# Patient Record
Sex: Male | Born: 1940 | Race: White | Hispanic: No | Marital: Married | Smoking: Never smoker
Health system: Southern US, Community
[De-identification: ages and names within clinical notes are randomized; demographics above are authoritative.]

## PROBLEM LIST (undated history)

## (undated) DIAGNOSIS — C801 Malignant (primary) neoplasm, unspecified: Secondary | ICD-10-CM

## (undated) DIAGNOSIS — I10 Essential (primary) hypertension: Secondary | ICD-10-CM

## (undated) DIAGNOSIS — E785 Hyperlipidemia, unspecified: Secondary | ICD-10-CM

## (undated) DIAGNOSIS — L570 Actinic keratosis: Secondary | ICD-10-CM

## (undated) DIAGNOSIS — I739 Peripheral vascular disease, unspecified: Secondary | ICD-10-CM

## (undated) DIAGNOSIS — K219 Gastro-esophageal reflux disease without esophagitis: Secondary | ICD-10-CM

## (undated) HISTORY — PX: CATARACT EXTRACTION, BILATERAL: SHX1313

## (undated) HISTORY — DX: Essential (primary) hypertension: I10

## (undated) HISTORY — PX: OTHER SURGICAL HISTORY: SHX169

## (undated) HISTORY — DX: Hyperlipidemia, unspecified: E78.5

## (undated) HISTORY — DX: Actinic keratosis: L57.0

## (undated) HISTORY — DX: Gastro-esophageal reflux disease without esophagitis: K21.9

---

## 2017-11-16 DIAGNOSIS — K219 Gastro-esophageal reflux disease without esophagitis: Secondary | ICD-10-CM | POA: Insufficient documentation

## 2017-11-16 DIAGNOSIS — I10 Essential (primary) hypertension: Secondary | ICD-10-CM | POA: Insufficient documentation

## 2017-11-16 DIAGNOSIS — M1712 Unilateral primary osteoarthritis, left knee: Secondary | ICD-10-CM | POA: Insufficient documentation

## 2017-11-16 DIAGNOSIS — E785 Hyperlipidemia, unspecified: Secondary | ICD-10-CM | POA: Insufficient documentation

## 2018-01-19 ENCOUNTER — Ambulatory Visit: Payer: Medicare Other | Admitting: Internal Medicine

## 2018-12-20 DIAGNOSIS — Z96652 Presence of left artificial knee joint: Secondary | ICD-10-CM | POA: Insufficient documentation

## 2019-03-23 DIAGNOSIS — C4491 Basal cell carcinoma of skin, unspecified: Secondary | ICD-10-CM

## 2019-03-23 HISTORY — DX: Basal cell carcinoma of skin, unspecified: C44.91

## 2019-05-30 ENCOUNTER — Other Ambulatory Visit: Payer: Self-pay | Admitting: Internal Medicine

## 2019-05-30 DIAGNOSIS — G459 Transient cerebral ischemic attack, unspecified: Secondary | ICD-10-CM

## 2019-05-30 DIAGNOSIS — H539 Unspecified visual disturbance: Secondary | ICD-10-CM

## 2019-06-15 ENCOUNTER — Ambulatory Visit
Admission: RE | Admit: 2019-06-15 | Discharge: 2019-06-15 | Disposition: A | Discharge status: Home / Self Care | Payer: Medicare Other | Source: Ambulatory Visit | Attending: Internal Medicine | Admitting: Internal Medicine

## 2019-06-15 ENCOUNTER — Other Ambulatory Visit: Payer: Self-pay

## 2019-06-15 DIAGNOSIS — G459 Transient cerebral ischemic attack, unspecified: Secondary | ICD-10-CM | POA: Insufficient documentation

## 2019-06-15 DIAGNOSIS — H539 Unspecified visual disturbance: Secondary | ICD-10-CM | POA: Insufficient documentation

## 2019-06-22 ENCOUNTER — Ambulatory Visit (INDEPENDENT_AMBULATORY_CARE_PROVIDER_SITE_OTHER): Payer: Medicare Other | Admitting: Vascular Surgery

## 2019-06-22 ENCOUNTER — Encounter (INDEPENDENT_AMBULATORY_CARE_PROVIDER_SITE_OTHER): Payer: Self-pay | Admitting: Vascular Surgery

## 2019-06-22 ENCOUNTER — Other Ambulatory Visit: Payer: Self-pay

## 2019-06-22 VITALS — BP 139/93 | HR 82 | Resp 16 | Ht 68.0 in | Wt 198.0 lb

## 2019-06-22 DIAGNOSIS — I6523 Occlusion and stenosis of bilateral carotid arteries: Secondary | ICD-10-CM

## 2019-06-22 DIAGNOSIS — I10 Essential (primary) hypertension: Secondary | ICD-10-CM | POA: Diagnosis not present

## 2019-06-22 DIAGNOSIS — E782 Mixed hyperlipidemia: Secondary | ICD-10-CM | POA: Diagnosis not present

## 2019-06-22 DIAGNOSIS — K219 Gastro-esophageal reflux disease without esophagitis: Secondary | ICD-10-CM | POA: Diagnosis not present

## 2019-06-22 DIAGNOSIS — M1712 Unilateral primary osteoarthritis, left knee: Secondary | ICD-10-CM

## 2019-06-22 NOTE — Progress Notes (Signed)
MRN : [id number]  Gregory Stark is a 78 y.o. (05-26-1941) male who presents with chief complaint of No chief complaint on file. Marland Kitchen  History of Present Illness:   The patient is seen for evaluation of carotid stenosis. The carotid stenosis was identified after duplex ultrasound was obtained secondary to double vision.  Patient reports (and his wife is with him who confirms) he has had episodes of double vision for over a year.  Typically they last for a few minutes.  The concern recently was they seem to be occurring more frequently and lasting a bit longer.  The patient denies true amaurosis fugax. There is no recent history of TIA symptoms or focal motor deficits. There is no prior documented CVA.  There is no history of migraine headaches. There is no history of seizures.  The patient is taking enteric-coated aspirin 81 mg daily.  The patient has a history of coronary artery disease, no recent episodes of angina or shortness of breath. The patient denies PAD or claudication symptoms. There is a history of hyperlipidemia which is being treated with a statin.  CT scan of the head without contrast dated 06/15/2019 is reviewed by myself no evidence of infarct.  Calcifications are noted at the carotid siphon.  Small vessel disease is also identified.  Carotid ultrasound dated 06/15/2019 is reviewed by me and is consistent with a 60 to 79% stenosis of the right internal carotid and a 40 to 59% diameter reduction of the left internal carotid  No outpatient medications have been marked as taking for the 06/22/19 encounter (Appointment) with Delana Meyer, Dolores Lory, MD.    No past medical history on file.    Social History Social History   Tobacco Use  . Smoking status: Not on file  Substance Use Topics  . Alcohol use: Not on file  . Drug use: Not on file    Family History No family history on file.  No family history of bleeding/clotting disorders, porphyria or autoimmune  disease   Not on File   REVIEW OF SYSTEMS (Negative unless checked)  Constitutional: [] Weight loss  [] Fever  [] Chills Cardiac: [] Chest pain   [] Chest pressure   [] Palpitations   [] Shortness of breath when laying flat   [] Shortness of breath with exertion. Vascular:  [] Pain in legs with walking   [] Pain in legs at rest  [] History of DVT   [] Phlebitis   [] Swelling in legs   [] Varicose veins   [] Non-healing ulcers Pulmonary:   [] Uses home oxygen   [] Productive cough   [] Hemoptysis   [] Wheeze  [] COPD   [] Asthma Neurologic:  [] Dizziness   [] Seizures   [] History of stroke   [x] History of TIA  [] Aphasia   [x] Vissual changes   [] Weakness or numbness in arm   [] Weakness or numbness in leg Musculoskeletal:   [] Joint swelling   [] Joint pain   [] Low back pain Hematologic:  [] Easy bruising  [] Easy bleeding   [] Hypercoagulable state   [] Anemic Gastrointestinal:  [] Diarrhea   [] Vomiting  [x] Gastroesophageal reflux/heartburn   [] Difficulty swallowing. Genitourinary:  [] Chronic kidney disease   [] Difficult urination  [] Frequent urination   [] Blood in urine Skin:  [] Rashes   [] Ulcers  Psychological:  [] History of anxiety   []  History of major depression.  Physical Examination  There were no vitals filed for this visit. There is no height or weight on file to calculate BMI. Gen: WD/WN, NAD Head: [place]/AT, No temporalis wasting.  Ear/Nose/Throat: Hearing grossly intact, nares w/o erythema or  drainage, poor dentition Eyes: PER, EOMI, sclera nonicteric.  Neck: Supple, no masses.  No bruit or JVD.  Pulmonary:  Good air movement, clear to auscultation bilaterally, no use of accessory muscles.  Cardiac: RRR, normal S1, S2, no Murmurs. Vascular: Right carotid bruit Vessel Right Left  Radial Palpable Palpable  Brachial Palpable Palpable  Carotid Palpable Palpable  Gastrointestinal: soft, non-distended. No guarding/no peritoneal signs.  Musculoskeletal: M/S 5/5 throughout.  No deformity or atrophy.    Neurologic: CN 2-12 intact. Pain and light touch intact in extremities.  Symmetrical.  Speech is fluent. Motor exam as listed above. Psychiatric: Judgment intact, Mood & affect appropriate for pt's clinical situation. Dermatologic: No rashes or ulcers noted.  No changes consistent with cellulitis.  CBC No results found for: WBC, HGB, HCT, MCV, PLT  BMET No results found for: NA, K, CL, CO2, GLUCOSE, BUN, CREATININE, CALCIUM, GFRNONAA, GFRAA CrCl cannot be calculated (No successful lab value found.).  COAG No results found for: INR, PROTIME  Radiology CT HEAD WO CONTRAST  Result Date: 06/15/2019 CLINICAL DATA:  Episodic double vision for the past 1.5 years EXAM: CT HEAD WITHOUT CONTRAST TECHNIQUE: Contiguous axial images were obtained from the base of the skull through the vertex without intravenous contrast. COMPARISON:  None. FINDINGS: Brain: No evidence of acute infarction, hemorrhage, hydrocephalus, extra-axial collection or mass lesion/mass effect. Symmetric prominence of the ventricles, cisterns and sulci compatible with parenchymal volume loss. Patchy areas of white matter hypoattenuation are most compatible with chronic microvascular angiopathy. Vascular: Atherosclerotic calcification of the carotid siphons. No hyperdense vessel. Skull: No calvarial fracture or suspicious osseous lesion. No scalp swelling or hematoma. Sinuses/Orbits: Paranasal sinuses and mastoid air cells are predominantly clear. Included orbital structures are unremarkable. Other: None IMPRESSION: 1. No acute intracranial abnormality. 2. 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 Electronically Signed   By: Lovena Le M.D.   On: 06/15/2019 17:29     Assessment/Plan 1. Bilateral carotid artery stenosis The patient remains asymptomatic with respect to the carotid stenosis.  However, the patient has now progressed and has a lesion the is >70%.  Patient should undergo CT  angiography of the carotid arteries to define the degree of stenosis of the internal carotid arteries bilaterally and the anatomic suitability for surgery vs. intervention.  If the patient does indeed need surgery cardiac clearance will be required, once cleared the patient will be scheduled for surgery.  The risks, benefits and alternative therapies were reviewed in detail with the patient.  All questions were answered.  The patient agrees to proceed with imaging.  Continue antiplatelet therapy as prescribed. Continue management of CAD, HTN and Hyperlipidemia. Healthy heart diet, encouraged exercise at least 4 times per week.   - CT ANGIO NECK W OR WO CONTRAST; Future  2. HTN, goal below 140/80 Continue antihypertensive medications as already ordered, these medications have been reviewed and there are no changes at this time.   3. Gastroesophageal reflux disease without esophagitis Continue PPI as already ordered, this medication has been reviewed and there are no changes at this time.  Avoidence of caffeine and alcohol  Moderate elevation of the head of the bed   4. Mixed hyperlipidemia Continue statin as ordered and reviewed, no changes at this time   5. Primary osteoarthritis of left knee Continue NSAID medications as already ordered, these medications have been reviewed and there are no changes at this time.  Continued activity and therapy was stressed.   Hortencia Pilar, MD  06/22/2019  8:39 AM

## 2019-06-23 ENCOUNTER — Encounter (INDEPENDENT_AMBULATORY_CARE_PROVIDER_SITE_OTHER): Payer: Self-pay | Admitting: Vascular Surgery

## 2019-06-23 DIAGNOSIS — I6529 Occlusion and stenosis of unspecified carotid artery: Secondary | ICD-10-CM | POA: Insufficient documentation

## 2019-07-05 ENCOUNTER — Other Ambulatory Visit: Payer: Self-pay

## 2019-07-05 ENCOUNTER — Ambulatory Visit
Admission: RE | Admit: 2019-07-05 | Discharge: 2019-07-05 | Disposition: A | Discharge status: Home / Self Care | Payer: Medicare Other | Source: Ambulatory Visit | Attending: Vascular Surgery | Admitting: Vascular Surgery

## 2019-07-05 DIAGNOSIS — I6523 Occlusion and stenosis of bilateral carotid arteries: Secondary | ICD-10-CM | POA: Insufficient documentation

## 2019-07-05 LAB — POCT I-STAT CREATININE: Creatinine, Ser: 1.2 mg/dL (ref 0.61–1.24)

## 2019-07-05 MED ORDER — IOHEXOL 350 MG/ML SOLN
75.0000 mL | Freq: Once | INTRAVENOUS | Status: AC | PRN
  Administered 2019-07-05: 08:00:00 75 mL via INTRAVENOUS

## 2019-07-10 ENCOUNTER — Encounter (INDEPENDENT_AMBULATORY_CARE_PROVIDER_SITE_OTHER): Payer: Self-pay

## 2019-07-13 ENCOUNTER — Encounter (INDEPENDENT_AMBULATORY_CARE_PROVIDER_SITE_OTHER): Payer: Self-pay | Admitting: Vascular Surgery

## 2019-07-13 ENCOUNTER — Other Ambulatory Visit: Payer: Self-pay

## 2019-07-13 ENCOUNTER — Ambulatory Visit (INDEPENDENT_AMBULATORY_CARE_PROVIDER_SITE_OTHER): Payer: Medicare Other | Admitting: Vascular Surgery

## 2019-07-13 VITALS — BP 151/81 | HR 98 | Resp 16 | Wt 204.0 lb

## 2019-07-13 DIAGNOSIS — I6523 Occlusion and stenosis of bilateral carotid arteries: Secondary | ICD-10-CM | POA: Diagnosis not present

## 2019-07-13 DIAGNOSIS — I10 Essential (primary) hypertension: Secondary | ICD-10-CM | POA: Diagnosis not present

## 2019-07-13 DIAGNOSIS — K219 Gastro-esophageal reflux disease without esophagitis: Secondary | ICD-10-CM | POA: Diagnosis not present

## 2019-07-13 DIAGNOSIS — E782 Mixed hyperlipidemia: Secondary | ICD-10-CM

## 2019-07-13 NOTE — Progress Notes (Signed)
MRN : [id number]  Gregory Stark is a 79 y.o. (October 03, 1940) male who presents with chief complaint of No chief complaint on file. Marland Kitchen  History of Present Illness:   The patient is seen for follow up evaluation of carotid stenosis status post CT angiogram. CT scan was done 07/05/2019. Patient reports that the test went well with no problems or complications.   The patient denies interval amaurosis fugax. There is no recent or interval TIA symptoms or focal motor deficits. There is no prior documented CVA.  The patient is taking enteric-coated aspirin 81 mg daily.  There is no history of migraine headaches. There is no history of seizures.  The patient has a history of coronary artery disease, no recent episodes of angina or shortness of breath. The patient denies PAD or claudication symptoms. There is a history of hyperlipidemia which is being treated with a statin.    CT angiogram is reviewed by me personally and shows greater than 80% stenosis of the right internal carotid artery at its origin and 60 to 70% stenosis at the proximal internal carotid artery on the left.  As noted I have personally reviewed the scan and believe that the radiology report is under calling the right internal carotid artery stenosis the markings indicate they measured the internal carotid artery diameter on image 155 however if you look more proximally to image 152 clearly the diameter is quite a bit smaller suggesting even greater stenosis than 70% as I have indicated above.  No outpatient medications have been marked as taking for the 07/13/19 encounter (Appointment) with Delana Meyer, Dolores Lory, MD.    Past Medical History:  Diagnosis Date  . Gastroesophageal reflux disease   . Hyperlipidemia   . Hypertension     Past Surgical History:  Procedure Laterality Date  . arthroscoppy knee Left   . CATARACT EXTRACTION, BILATERAL      Social History Social History   Tobacco Use  . Smoking status: Never  Smoker  . Smokeless tobacco: Never Used  Substance Use Topics  . Alcohol use: Not on file  . Drug use: Not on file    Family History No family history on file.  No Known Allergies   REVIEW OF SYSTEMS (Negative unless checked)  Constitutional: [] Weight loss  [] Fever  [] Chills Cardiac: [] Chest pain   [] Chest pressure   [] Palpitations   [] Shortness of breath when laying flat   [] Shortness of breath with exertion. Vascular:  [] Pain in legs with walking   [] Pain in legs at rest  [] History of DVT   [] Phlebitis   [] Swelling in legs   [] Varicose veins   [] Non-healing ulcers Pulmonary:   [] Uses home oxygen   [] Productive cough   [] Hemoptysis   [] Wheeze  [] COPD   [] Asthma Neurologic:  [] Dizziness   [] Seizures   [] History of stroke   [] History of TIA  [] Aphasia   [] Vissual changes   [] Weakness or numbness in arm   [] Weakness or numbness in leg Musculoskeletal:   [] Joint swelling   [x] Joint pain   [] Low back pain Hematologic:  [] Easy bruising  [] Easy bleeding   [] Hypercoagulable state   [] Anemic Gastrointestinal:  [] Diarrhea   [] Vomiting  [] Gastroesophageal reflux/heartburn   [] Difficulty swallowing. Genitourinary:  [] Chronic kidney disease   [] Difficult urination  [] Frequent urination   [] Blood in urine Skin:  [] Rashes   [] Ulcers  Psychological:  [] History of anxiety   []  History of major depression.  Physical Examination  There were no vitals filed for this  visit. There is no height or weight on file to calculate BMI. Gen: WD/WN, NAD Head: [place]/AT, No temporalis wasting.  Ear/Nose/Throat: Hearing grossly intact, nares w/o erythema or drainage Eyes: PER, EOMI, sclera nonicteric.  Neck: Supple, no large masses.   Pulmonary:  Good air movement, no audible wheezing bilaterally, no use of accessory muscles.  Cardiac: RRR, no JVD Vascular: bilateral carotid bruit Gastrointestinal: Non-distended. No guarding/no peritoneal signs.  Musculoskeletal: M/S 5/5 throughout.  No deformity or atrophy.   Neurologic: CN 2-12 intact. Symmetrical.  Speech is fluent. Motor exam as listed above. Psychiatric: Judgment intact, Mood & affect appropriate for pt's clinical situation. Dermatologic: No rashes or ulcers noted.  No changes consistent with cellulitis. Lymph : No lichenification or skin changes of chronic lymphedema.  CBC No results found for: WBC, HGB, HCT, MCV, PLT  BMET    Component Value Date/Time   CREATININE 1.20 07/05/2019 0809   CrCl cannot be calculated (Unknown ideal weight.).  COAG No results found for: INR, PROTIME  Radiology CT HEAD WO CONTRAST  Result Date: 06/15/2019 CLINICAL DATA:  Episodic double vision for the past 1.5 years EXAM: CT HEAD WITHOUT CONTRAST TECHNIQUE: Contiguous axial images were obtained from the base of the skull through the vertex without intravenous contrast. COMPARISON:  None. FINDINGS: Brain: No evidence of acute infarction, hemorrhage, hydrocephalus, extra-axial collection or mass lesion/mass effect. Symmetric prominence of the ventricles, cisterns and sulci compatible with parenchymal volume loss. Patchy areas of white matter hypoattenuation are most compatible with chronic microvascular angiopathy. Vascular: Atherosclerotic calcification of the carotid siphons. No hyperdense vessel. Skull: No calvarial fracture or suspicious osseous lesion. No scalp swelling or hematoma. Sinuses/Orbits: Paranasal sinuses and mastoid air cells are predominantly clear. Included orbital structures are unremarkable. Other: None IMPRESSION: 1. No acute intracranial abnormality. 2. 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 Electronically Signed   By: Lovena Le M.D.   On: 06/15/2019 17:29   CT ANGIO NECK W OR WO CONTRAST  Result Date: 07/05/2019 CLINICAL DATA:  Carotid stenosis. EXAM: CT ANGIOGRAPHY NECK TECHNIQUE: Multidetector CT imaging of the neck was performed using the standard protocol during bolus administration of  intravenous contrast. Multiplanar CT image reconstructions and MIPs were obtained to evaluate the vascular anatomy. Carotid stenosis measurements (when applicable) are obtained utilizing NASCET criteria, using the distal internal carotid diameter as the denominator. CONTRAST:  41mL OMNIPAQUE IOHEXOL 350 MG/ML SOLN COMPARISON:  Carotid Doppler 06/15/2019 FINDINGS: Aortic arch: Atherosclerotic calcification aortic arch without aneurysm or dissection. Atherosclerotic calcification proximal great vessels without significant stenosis. Right carotid system: Right common carotid artery widely patent. Extensive atherosclerotic calcification proximal right internal carotid artery. Minimal luminal diameter 1.2 mm, corresponding to 70% diameter stenosis. Extensive atherosclerotic calcification in the cavernous carotid on the right which is patent. Left carotid system: Mild atherosclerotic disease left common carotid artery without stenosis. Calcific plaque proximal left internal carotid artery. Minimal luminal diameter 1.5 mm, corresponding to 65% diameter stenosis. Heavily calcified left cavernous carotid. Vertebral arteries: Extensive calcific plaque proximal right vertebral artery causing severe stenosis. Remainder of the right vertebral artery is patent to the basilar without stenosis. Occlusion proximal left vertebral artery with diffusely diseased proximal left vertebral artery reconstitutes in the mid neck and is then patent to the basilar. Skeleton: No acute skeletal abnormality. 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 Other neck: Negative for soft tissue mass or adenopathy. Upper chest: Patchy ground-glass density in the right upper lobe. Possible scarring or pneumonitis. No effusion. IMPRESSION: 1.  Heavily calcified plaque proximal right internal carotid artery, narrowing the lumen by 70% diameter stenosis. Extensive atherosclerotic calcification left cavernous carotid. 2. Calcific plaque  proximal left internal carotid artery narrowing the lumen by 65% diameter stenosis. Extensive atherosclerotic calcification left cavernous carotid 3. Severe calcific stenosis proximal right vertebral artery which is then patent to the basilar 4. 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 5. Patchy right upper lobe ground-glass density possible acute or chronic infiltrate. Electronically Signed   By: Franchot Gallo M.D.   On: 07/05/2019 09:18     Assessment/Plan 1. Bilateral carotid artery stenosis Recommend:  The patient remains asymptomatic with respect to the carotid stenosis.  However, the patient has now progressed and has a lesion the is >80%.  CT angiogram is reviewed by me personally and shows greater than 80% stenosis of the right internal carotid artery at its origin and 60 to 70% stenosis at the proximal internal carotid artery on the left.  As noted I have personally reviewed the scan and believe that the radiology report is under calling the right internal carotid artery stenosis the markings indicate they measured the internal carotid artery diameter on image 155 however if you look more proximally to image 152 clearly the diameter is quite a bit smaller suggesting even greater stenosis than 70% as I have indicated above.  On review of CT scan as noted above he does have significant calcifications.  His lesion is fairly low in the neck and amenable to surgery.  These 2 factors would favor endarterectomy over stenting however stenting would certainly be a possibility if significant cardiac morbidity were uncovered with his evaluation.  The patient does indeed need surgery, therefore, cardiac clearance will be arranged. Once cleared the patient will be scheduled for right carotid surgery.  The risks, benefits and alternative therapies were reviewed in detail with the patient.  All questions were answered.  The patient agrees to proceed with surgery  of the right  carotid artery.  Continue antiplatelet therapy as prescribed. Continue management of CAD, HTN and Hyperlipidemia. Healthy heart diet, encouraged exercise at least 4 times per week.   - Ambulatory referral to Cardiology  2. HTN, goal below 140/80 Continue antihypertensive medications as already ordered, these medications have been reviewed and there are no changes at this time.   3. Gastroesophageal reflux disease without esophagitis Continue PPI as already ordered, this medication has been reviewed and there are no changes at this time.  Avoidence of caffeine and alcohol  Moderate elevation of the head of the bed   4. Mixed hyperlipidemia Continue statin as ordered and reviewed, no changes at this time    Hortencia Pilar, MD  07/13/2019 8:31 AM

## 2019-07-18 DIAGNOSIS — R9431 Abnormal electrocardiogram [ECG] [EKG]: Secondary | ICD-10-CM | POA: Insufficient documentation

## 2019-07-24 ENCOUNTER — Encounter (INDEPENDENT_AMBULATORY_CARE_PROVIDER_SITE_OTHER): Payer: Self-pay

## 2019-07-31 ENCOUNTER — Telehealth (INDEPENDENT_AMBULATORY_CARE_PROVIDER_SITE_OTHER): Payer: Self-pay

## 2019-07-31 NOTE — Telephone Encounter (Signed)
I attempted to contact the patient on both his home and mobile number, a message was left on both for return call.

## 2019-07-31 NOTE — Telephone Encounter (Signed)
Patient returned my call and was given the information about his surgery on 08/09/19 with Dr. Delana Meyer. Patient will do his pre-op phone call on 08/07/19 between 8-1 and his covid testing on 08/08/19 between 12:30-2:30 pm at the [facility]. Pre-surgical instructions were discussed and will be mailed to the patient as well.

## 2019-08-07 ENCOUNTER — Other Ambulatory Visit (INDEPENDENT_AMBULATORY_CARE_PROVIDER_SITE_OTHER): Payer: Self-pay | Admitting: Nurse Practitioner

## 2019-08-07 ENCOUNTER — Other Ambulatory Visit: Payer: Self-pay

## 2019-08-07 ENCOUNTER — Encounter
Admission: RE | Admit: 2019-08-07 | Discharge: 2019-08-07 | Disposition: A | Discharge status: Home / Self Care | Payer: Medicare Other | Source: Ambulatory Visit | Attending: Vascular Surgery | Admitting: Vascular Surgery

## 2019-08-07 HISTORY — DX: Peripheral vascular disease, unspecified: I73.9

## 2019-08-07 HISTORY — DX: Malignant (primary) neoplasm, unspecified: C80.1

## 2019-08-07 NOTE — Patient Instructions (Signed)
Your procedure is scheduled on: Wed. 2/3 Report to Day Surgery. To find out your arrival time please call [phone] between 1PM - 3PM on Tues 2/2.  Remember: Instructions that are not followed completely may result in serious medical risk,  up to and including death, or upon the discretion of your surgeon and anesthesiologist your  surgery may need to be rescheduled.     _X__ 1. Do not eat food after midnight the night before your procedure.                 No gum chewing or hard candies. You may drink clear liquids up to 2 hours                 before you are scheduled to arrive for your surgery- DO not drink clear                 liquids within 2 hours of the start of your surgery.                 Clear Liquids include:  water, apple juice without pulp, clear carbohydrate                 drink such as Clearfast of Gatorade, Black Coffee or Tea (Do not add                 anything to coffee or tea).  __X__2.  On the morning of surgery brush your teeth with toothpaste and water, you                may rinse your mouth with mouthwash if you wish.  Do not swallow any toothpaste of mouthwash.     ___ 3.  No Alcohol for 24 hours before or after surgery.   ___ 4.  Do Not Smoke or use e-cigarettes For 24 Hours Prior to Your Surgery.                 Do not use any chewable tobacco products for at least 6 hours prior to                 surgery.  ____  5.  Bring all medications with you on the day of surgery if instructed.   __x__  6.  Notify your doctor if there is any change in your medical condition      (cold, fever, infections).     Do not wear jewelry, make-up, hairpins, clips or nail polish. Do not wear lotions, powders, or perfumes. You may wear deodorant. Do not shave 48 hours prior to surgery. Men may shave face and neck. Do not bring valuables to the hospital.    [facility] is not responsible for any belongings or valuables.  Contacts, dentures or  bridgework may not be worn into surgery. Leave your suitcase in the car. After surgery it may be brought to your room. For patients admitted to the hospital, discharge time is determined by your treatment team.   Patients discharged the day of surgery will not be allowed to drive home.   Please read over the following fact sheets that you were given:    ____ Take these medicines the morning of surgery with A SIP OF WATER:    1. none  2.   3.   4.  5.  6.  ____ Fleet Enema (as directed)   __x__ Use CHG Soap as directed  ____ Use inhalers on the day of  surgery  ____ Stop metformin 2 days prior to surgery    ____ Take 1/2 of usual insulin dose the night before surgery. No insulin the morning          of surgery.   ___x_ Continue aspirin   ___x_ Stop Anti-inflammatories on  No ibuprofen or aleve until after the surgery   ____ Stop supplements until after surgery.    ____ Bring C-Pap to the hospital.

## 2019-08-08 ENCOUNTER — Other Ambulatory Visit
Admission: RE | Admit: 2019-08-08 | Discharge: 2019-08-08 | Disposition: A | Discharge status: Home / Self Care | Payer: Medicare Other | Source: Ambulatory Visit | Attending: Vascular Surgery | Admitting: Vascular Surgery

## 2019-08-08 LAB — BASIC METABOLIC PANEL
Anion gap: 12 (ref 5–15)
BUN: 18 mg/dL (ref 8–23)
CO2: 23 mmol/L (ref 22–32)
Calcium: 9.9 mg/dL (ref 8.9–10.3)
Chloride: 102 mmol/L (ref 98–111)
Creatinine, Ser: 1.25 mg/dL — ABNORMAL HIGH (ref 0.61–1.24)
GFR calc Af Amer: >60 mL/min (ref >60)
GFR calc non Af Amer: 55 mL/min — ABNORMAL LOW (ref >60)
Glucose, Bld: 119 mg/dL — ABNORMAL HIGH (ref 70–99)
Potassium: 3.8 mmol/L (ref 3.5–5.1)
Sodium: 137 mmol/L (ref 135–145)

## 2019-08-08 LAB — CBC WITH DIFFERENTIAL/PLATELET
Abs Immature Granulocytes: 0.02 10*3/uL (ref 0.00–0.07)
Basophils Absolute: 0.1 10*3/uL (ref 0.0–0.1)
Basophils Relative: 1 %
Eosinophils Absolute: 0.3 10*3/uL (ref 0.0–0.5)
Eosinophils Relative: 4 %
HCT: 49.3 % (ref 39.0–52.0)
Hemoglobin: 17.2 g/dL — ABNORMAL HIGH (ref 13.0–17.0)
Immature Granulocytes: 0 %
Lymphocytes Relative: 27 %
Lymphs Abs: 1.9 10*3/uL (ref 0.7–4.0)
MCH: 31.9 pg (ref 26.0–34.0)
MCHC: 34.9 g/dL (ref 30.0–36.0)
MCV: 91.3 fL (ref 80.0–100.0)
Monocytes Absolute: 0.7 10*3/uL (ref 0.1–1.0)
Monocytes Relative: 10 %
Neutro Abs: 4 10*3/uL (ref 1.7–7.7)
Neutrophils Relative %: 58 %
Platelets: 210 10*3/uL (ref 150–400)
RBC: 5.4 MIL/uL (ref 4.22–5.81)
RDW: 13.2 % (ref 11.5–15.5)
WBC: 6.9 10*3/uL (ref 4.0–10.5)
nRBC: 0 % (ref 0.0–0.2)

## 2019-08-08 LAB — TYPE AND SCREEN
ABO/RH(D): B POS
Antibody Screen: NEGATIVE

## 2019-08-08 LAB — PROTIME-INR
INR: 1 (ref 0.8–1.2)
Prothrombin Time: 13.4 seconds (ref 11.4–15.2)

## 2019-08-08 LAB — SARS CORONAVIRUS 2 (TAT 6-24 HRS): SARS Coronavirus 2: NEGATIVE

## 2019-08-08 LAB — APTT: aPTT: 28 seconds (ref 24–36)

## 2019-08-09 ENCOUNTER — Inpatient Hospital Stay: Payer: Medicare Other

## 2019-08-09 ENCOUNTER — Other Ambulatory Visit: Payer: Self-pay

## 2019-08-09 ENCOUNTER — Encounter
Admission: RE | Disposition: A | Discharge status: Home / Self Care | Payer: Self-pay | Source: Home / Self Care | Attending: Vascular Surgery

## 2019-08-09 ENCOUNTER — Encounter: Payer: Self-pay | Admitting: Vascular Surgery

## 2019-08-09 ENCOUNTER — Inpatient Hospital Stay
Admission: RE | Admit: 2019-08-09 | Discharge: 2019-08-10 | DRG: 039 | Disposition: A | Discharge status: Home / Self Care | Payer: Medicare Other | Attending: Vascular Surgery | Admitting: Vascular Surgery

## 2019-08-09 DIAGNOSIS — E782 Mixed hyperlipidemia: Secondary | ICD-10-CM | POA: Diagnosis present

## 2019-08-09 DIAGNOSIS — I739 Peripheral vascular disease, unspecified: Secondary | ICD-10-CM | POA: Diagnosis present

## 2019-08-09 DIAGNOSIS — I10 Essential (primary) hypertension: Secondary | ICD-10-CM | POA: Diagnosis present

## 2019-08-09 DIAGNOSIS — I6521 Occlusion and stenosis of right carotid artery: Secondary | ICD-10-CM | POA: Diagnosis present

## 2019-08-09 DIAGNOSIS — K219 Gastro-esophageal reflux disease without esophagitis: Secondary | ICD-10-CM | POA: Diagnosis present

## 2019-08-09 DIAGNOSIS — Z20822 Contact with and (suspected) exposure to covid-19: Secondary | ICD-10-CM | POA: Diagnosis present

## 2019-08-09 HISTORY — PX: ENDARTERECTOMY: SHX5162

## 2019-08-09 LAB — MRSA PCR SCREENING: MRSA by PCR: NEGATIVE

## 2019-08-09 LAB — ABO/RH: ABO/RH(D): B POS

## 2019-08-09 LAB — GLUCOSE, CAPILLARY: Glucose-Capillary: 108 mg/dL — ABNORMAL HIGH (ref 70–99)

## 2019-08-09 SURGERY — ENDARTERECTOMY, CAROTID
Anesthesia: General | Site: Neck | Laterality: Right

## 2019-08-09 MED ORDER — PHENYLEPHRINE HCL-NACL 10-0.9 MG/250ML-% IV SOLN
INTRAVENOUS | Status: DC | PRN
  Administered 2019-08-09: 20 ug/min via INTRAVENOUS

## 2019-08-09 MED ORDER — PROPOFOL 10 MG/ML IV BOLUS
INTRAVENOUS | Status: AC
  Filled 2019-08-09: qty 40

## 2019-08-09 MED ORDER — CEFAZOLIN SODIUM-DEXTROSE 2-4 GM/100ML-% IV SOLN
2.0000 g | INTRAVENOUS | Status: AC
  Administered 2019-08-09: 2 g via INTRAVENOUS

## 2019-08-09 MED ORDER — FENTANYL CITRATE (PF) 100 MCG/2ML IJ SOLN: 25.0000 ug | INTRAMUSCULAR | Status: DC | PRN

## 2019-08-09 MED ORDER — VITAMIN D (ERGOCALCIFEROL) 1.25 MG (50000 UNIT) PO CAPS
50000.0000 [IU] | ORAL_CAPSULE | ORAL | Status: DC
  Filled 2019-08-09: qty 1

## 2019-08-09 MED ORDER — ROCURONIUM BROMIDE 50 MG/5ML IV SOLN
INTRAVENOUS | Status: AC
  Filled 2019-08-09: qty 1

## 2019-08-09 MED ORDER — DEXAMETHASONE SODIUM PHOSPHATE 10 MG/ML IJ SOLN
INTRAMUSCULAR | Status: DC | PRN
  Administered 2019-08-09: 10 mg via INTRAVENOUS

## 2019-08-09 MED ORDER — DOCUSATE SODIUM 100 MG PO CAPS
100.0000 mg | ORAL_CAPSULE | Freq: Every day | ORAL | Status: DC
  Administered 2019-08-10: 100 mg via ORAL
  Filled 2019-08-09: qty 1

## 2019-08-09 MED ORDER — VASOPRESSIN 20 UNIT/ML IV SOLN
INTRAVENOUS | Status: AC
  Filled 2019-08-09: qty 1

## 2019-08-09 MED ORDER — MEPERIDINE HCL 50 MG/ML IJ SOLN: 6.2500 mg | INTRAMUSCULAR | Status: DC | PRN

## 2019-08-09 MED ORDER — DEXMEDETOMIDINE HCL 200 MCG/2ML IV SOLN
INTRAVENOUS | Status: DC | PRN
  Administered 2019-08-09 (×2): 12 ug via INTRAVENOUS

## 2019-08-09 MED ORDER — VASOPRESSIN 20 UNIT/ML IV SOLN
INTRAVENOUS | Status: DC | PRN
  Administered 2019-08-09 (×3): 1 m[IU] via INTRAVENOUS

## 2019-08-09 MED ORDER — PRAVASTATIN SODIUM 40 MG PO TABS
40.0000 mg | ORAL_TABLET | Freq: Every day | ORAL | Status: DC
  Administered 2019-08-09: 40 mg via ORAL
  Filled 2019-08-09: qty 2
  Filled 2019-08-09 (×2): qty 1

## 2019-08-09 MED ORDER — LIDOCAINE HCL (PF) 1 % IJ SOLN
INTRAMUSCULAR | Status: AC
  Filled 2019-08-09: qty 30

## 2019-08-09 MED ORDER — OXYCODONE HCL 5 MG PO TABS: 5.0000 mg | ORAL_TABLET | Freq: Once | ORAL | Status: DC | PRN

## 2019-08-09 MED ORDER — FAMOTIDINE 20 MG PO TABS: 20.0000 mg | ORAL_TABLET | Freq: Once | ORAL | Status: AC

## 2019-08-09 MED ORDER — ALUM & MAG HYDROXIDE-SIMETH 200-200-20 MG/5ML PO SUSP: 15.0000 mL | ORAL | Status: DC | PRN

## 2019-08-09 MED ORDER — ASPIRIN EC 81 MG PO TBEC: 81.0000 mg | DELAYED_RELEASE_TABLET | Freq: Every day | ORAL | Status: DC

## 2019-08-09 MED ORDER — CEFAZOLIN SODIUM-DEXTROSE 2-4 GM/100ML-% IV SOLN
INTRAVENOUS | Status: AC
  Filled 2019-08-09: qty 100

## 2019-08-09 MED ORDER — ONDANSETRON HCL 4 MG/2ML IJ SOLN: 4.0000 mg | Freq: Four times a day (QID) | INTRAMUSCULAR | Status: DC | PRN

## 2019-08-09 MED ORDER — ASPIRIN 81 MG PO TBEC
81.0000 mg | DELAYED_RELEASE_TABLET | Freq: Every day | ORAL | Status: DC
  Administered 2019-08-10: 81 mg via ORAL
  Filled 2019-08-09 (×2): qty 1

## 2019-08-09 MED ORDER — LACTATED RINGERS IV SOLN: INTRAVENOUS | Status: DC

## 2019-08-09 MED ORDER — LIDOCAINE HCL (CARDIAC) PF 100 MG/5ML IV SOSY
PREFILLED_SYRINGE | INTRAVENOUS | Status: DC | PRN
  Administered 2019-08-09: 100 mg via INTRAVENOUS

## 2019-08-09 MED ORDER — GUAIFENESIN-DM 100-10 MG/5ML PO SYRP: 15.0000 mL | ORAL_SOLUTION | ORAL | Status: DC | PRN

## 2019-08-09 MED ORDER — ONDANSETRON HCL 4 MG/2ML IJ SOLN
INTRAMUSCULAR | Status: AC
  Filled 2019-08-09: qty 2

## 2019-08-09 MED ORDER — FENTANYL CITRATE (PF) 100 MCG/2ML IJ SOLN
INTRAMUSCULAR | Status: AC
  Filled 2019-08-09: qty 2

## 2019-08-09 MED ORDER — SUCCINYLCHOLINE CHLORIDE 20 MG/ML IJ SOLN
INTRAMUSCULAR | Status: AC
  Filled 2019-08-09: qty 1

## 2019-08-09 MED ORDER — CHLORHEXIDINE GLUCONATE CLOTH 2 % EX PADS: 6.0000 | MEDICATED_PAD | Freq: Once | CUTANEOUS | Status: DC

## 2019-08-09 MED ORDER — HEPARIN SODIUM (PORCINE) 5000 UNIT/ML IJ SOLN
INTRAMUSCULAR | Status: AC
  Filled 2019-08-09: qty 1

## 2019-08-09 MED ORDER — DEXMEDETOMIDINE HCL IN NACL 80 MCG/20ML IV SOLN
INTRAVENOUS | Status: AC
  Filled 2019-08-09: qty 20

## 2019-08-09 MED ORDER — CEFAZOLIN SODIUM-DEXTROSE 2-4 GM/100ML-% IV SOLN
2.0000 g | Freq: Three times a day (TID) | INTRAVENOUS | Status: AC
  Administered 2019-08-09 (×2): 2 g via INTRAVENOUS
  Filled 2019-08-09 (×2): qty 100

## 2019-08-09 MED ORDER — HEMOSTATIC AGENTS (NO CHARGE) OPTIME
TOPICAL | Status: DC | PRN
  Administered 2019-08-09: 1 via TOPICAL

## 2019-08-09 MED ORDER — HEPARIN SODIUM (PORCINE) 1000 UNIT/ML IJ SOLN
INTRAMUSCULAR | Status: AC
  Filled 2019-08-09: qty 1

## 2019-08-09 MED ORDER — PROMETHAZINE HCL 25 MG/ML IJ SOLN: 6.2500 mg | INTRAMUSCULAR | Status: DC | PRN

## 2019-08-09 MED ORDER — LIDOCAINE HCL (PF) 2 % IJ SOLN
INTRAMUSCULAR | Status: AC
  Filled 2019-08-09: qty 10

## 2019-08-09 MED ORDER — HEPARIN SODIUM (PORCINE) 1000 UNIT/ML IJ SOLN
INTRAMUSCULAR | Status: DC | PRN
  Administered 2019-08-09: 7000 [IU] via INTRAVENOUS

## 2019-08-09 MED ORDER — SUGAMMADEX SODIUM 200 MG/2ML IV SOLN
INTRAVENOUS | Status: DC | PRN
  Administered 2019-08-09: 200 mg via INTRAVENOUS

## 2019-08-09 MED ORDER — ACETAMINOPHEN 325 MG PO TABS
325.0000 mg | ORAL_TABLET | ORAL | Status: DC | PRN
  Administered 2019-08-10: 650 mg via ORAL
  Filled 2019-08-09: qty 2

## 2019-08-09 MED ORDER — PHENYLEPHRINE HCL (PRESSORS) 10 MG/ML IV SOLN
INTRAVENOUS | Status: DC | PRN
  Administered 2019-08-09: 200 ug via INTRAVENOUS
  Administered 2019-08-09 (×3): 100 ug via INTRAVENOUS

## 2019-08-09 MED ORDER — KCL IN DEXTROSE-NACL 20-5-0.9 MEQ/L-%-% IV SOLN
INTRAVENOUS | Status: DC
  Filled 2019-08-09 (×2): qty 1000

## 2019-08-09 MED ORDER — SODIUM CHLORIDE 0.9 % IV SOLN
INTRAVENOUS | Status: DC | PRN
  Administered 2019-08-09: 500 mL via INTRAMUSCULAR

## 2019-08-09 MED ORDER — ROCURONIUM BROMIDE 100 MG/10ML IV SOLN
INTRAVENOUS | Status: DC | PRN
  Administered 2019-08-09: 40 mg via INTRAVENOUS
  Administered 2019-08-09: 50 mg via INTRAVENOUS

## 2019-08-09 MED ORDER — ACETAMINOPHEN 650 MG RE SUPP: 325.0000 mg | RECTAL | Status: DC | PRN

## 2019-08-09 MED ORDER — DEXAMETHASONE SODIUM PHOSPHATE 10 MG/ML IJ SOLN
INTRAMUSCULAR | Status: AC
  Filled 2019-08-09: qty 1

## 2019-08-09 MED ORDER — FENTANYL CITRATE (PF) 100 MCG/2ML IJ SOLN
INTRAMUSCULAR | Status: DC | PRN
  Administered 2019-08-09: 25 ug via INTRAVENOUS
  Administered 2019-08-09: 50 ug via INTRAVENOUS
  Administered 2019-08-09: 25 ug via INTRAVENOUS
  Administered 2019-08-09: 50 ug via INTRAVENOUS
  Administered 2019-08-09: 25 ug via INTRAVENOUS

## 2019-08-09 MED ORDER — PHENOL 1.4 % MT LIQD
1.0000 | OROMUCOSAL | Status: DC | PRN
  Filled 2019-08-09: qty 177

## 2019-08-09 MED ORDER — PROPOFOL 10 MG/ML IV BOLUS
INTRAVENOUS | Status: DC | PRN
  Administered 2019-08-09: 40 mg via INTRAVENOUS
  Administered 2019-08-09: 20 mg via INTRAVENOUS
  Administered 2019-08-09: 110 mg via INTRAVENOUS

## 2019-08-09 MED ORDER — PANTOPRAZOLE SODIUM 40 MG IV SOLR
40.0000 mg | Freq: Every day | INTRAVENOUS | Status: DC
  Administered 2019-08-09: 40 mg via INTRAVENOUS
  Filled 2019-08-09: qty 40

## 2019-08-09 MED ORDER — FIBRIN SEALANT 2 ML SINGLE DOSE KIT
PACK | CUTANEOUS | Status: DC | PRN
  Administered 2019-08-09: 2 mL via TOPICAL

## 2019-08-09 MED ORDER — METOPROLOL TARTRATE 5 MG/5ML IV SOLN: 2.0000 mg | INTRAVENOUS | Status: DC | PRN

## 2019-08-09 MED ORDER — LABETALOL HCL 5 MG/ML IV SOLN: 10.0000 mg | INTRAVENOUS | Status: DC | PRN

## 2019-08-09 MED ORDER — SODIUM CHLORIDE 0.9 % IV SOLN: 500.0000 mL | Freq: Once | INTRAVENOUS | Status: DC | PRN

## 2019-08-09 MED ORDER — LISINOPRIL 10 MG PO TABS
10.0000 mg | ORAL_TABLET | Freq: Every day | ORAL | Status: DC
  Administered 2019-08-09: 10 mg via ORAL
  Filled 2019-08-09: qty 1
  Filled 2019-08-09: qty 2

## 2019-08-09 MED ORDER — HYDRALAZINE HCL 20 MG/ML IJ SOLN: 5.0000 mg | INTRAMUSCULAR | Status: DC | PRN

## 2019-08-09 MED ORDER — FAMOTIDINE 20 MG PO TABS
ORAL_TABLET | ORAL | Status: AC
  Administered 2019-08-09: 20 mg via ORAL
  Filled 2019-08-09: qty 1

## 2019-08-09 MED ORDER — OXYCODONE HCL 5 MG/5ML PO SOLN: 5.0000 mg | Freq: Once | ORAL | Status: DC | PRN

## 2019-08-09 SURGICAL SUPPLY — 66 items
APPLIER CLIP 11 MED OPEN (CLIP)
APPLIER CLIP 9.375 SM OPEN (CLIP)
BAG DECANTER FOR FLEXI CONT (MISCELLANEOUS) ×3 IMPLANT
BLADE SURG 15 STRL LF DISP TIS (BLADE) ×1 IMPLANT
BLADE SURG 15 STRL SS (BLADE) ×2
BLADE SURG SZ11 CARB STEEL (BLADE) ×3 IMPLANT
BOOT SUTURE AID YELLOW STND (SUTURE) ×3 IMPLANT
BRUSH SCRUB EZ  4% CHG (MISCELLANEOUS) ×2
BRUSH SCRUB EZ 4% CHG (MISCELLANEOUS) ×1 IMPLANT
CANISTER SUCT 1200ML W/VALVE (MISCELLANEOUS) ×3 IMPLANT
CHLORAPREP W/TINT 26 (MISCELLANEOUS) ×3 IMPLANT
CLIP APPLIE 11 MED OPEN (CLIP) IMPLANT
CLIP APPLIE 9.375 SM OPEN (CLIP) IMPLANT
COVER WAND RF STERILE (DRAPES) ×3 IMPLANT
DERMABOND ADVANCED (GAUZE/BANDAGES/DRESSINGS) ×2
DERMABOND ADVANCED .7 DNX12 (GAUZE/BANDAGES/DRESSINGS) ×1 IMPLANT
DRAPE 3/4 80X56 (DRAPES) ×3 IMPLANT
DRAPE INCISE IOBAN 66X45 STRL (DRAPES) ×3 IMPLANT
DRAPE LAPAROTOMY 100X77 ABD (DRAPES) ×3 IMPLANT
DRESSING SURGICEL FIBRLLR 1X2 (HEMOSTASIS) ×1 IMPLANT
DRSG SURGICEL FIBRILLAR 1X2 (HEMOSTASIS) ×3
ELECT CAUTERY BLADE 6.4 (BLADE) ×3 IMPLANT
ELECT REM PT RETURN 9FT ADLT (ELECTROSURGICAL) ×3
ELECTRODE REM PT RTRN 9FT ADLT (ELECTROSURGICAL) ×1 IMPLANT
GLOVE BIO SURGEON STRL SZ7 (GLOVE) ×3 IMPLANT
GLOVE INDICATOR 7.5 STRL GRN (GLOVE) ×3 IMPLANT
GLOVE SURG SYN 8.0 (GLOVE) ×3 IMPLANT
GOWN STRL REUS W/ TWL LRG LVL3 (GOWN DISPOSABLE) ×2 IMPLANT
GOWN STRL REUS W/ TWL XL LVL3 (GOWN DISPOSABLE) ×1 IMPLANT
GOWN STRL REUS W/TWL LRG LVL3 (GOWN DISPOSABLE) ×4
GOWN STRL REUS W/TWL XL LVL3 (GOWN DISPOSABLE) ×2
HOLDER FOLEY CATH W/STRAP (MISCELLANEOUS) ×3 IMPLANT
IV NS 500ML (IV SOLUTION) ×2
IV NS 500ML BAXH (IV SOLUTION) ×1 IMPLANT
KIT TURNOVER KIT A (KITS) ×3 IMPLANT
LABEL OR SOLS (LABEL) ×3 IMPLANT
LOOP RED MAXI  1X406MM (MISCELLANEOUS) ×4
LOOP VESSEL MAXI 1X406 RED (MISCELLANEOUS) ×2 IMPLANT
LOOP VESSEL MINI 0.8X406 BLUE (MISCELLANEOUS) ×1 IMPLANT
LOOPS BLUE MINI 0.8X406MM (MISCELLANEOUS) ×2
NEEDLE FILTER BLUNT 18X 1/2SAF (NEEDLE) ×2
NEEDLE FILTER BLUNT 18X1 1/2 (NEEDLE) ×1 IMPLANT
NEEDLE HYPO 25X1 1.5 SAFETY (NEEDLE) ×3 IMPLANT
NS IRRIG 500ML POUR BTL (IV SOLUTION) ×3 IMPLANT
PACK BASIN MAJOR ARMC (MISCELLANEOUS) ×3 IMPLANT
PATCH CAROTID ECM VASC 1X10 (Prosthesis & Implant Heart) ×3 IMPLANT
PENCIL ELECTRO HAND CTR (MISCELLANEOUS) IMPLANT
SHUNT CAROTID STR REINF 3.0X4. (MISCELLANEOUS) ×3 IMPLANT
SUT MNCRL+ 5-0 UNDYED PC-3 (SUTURE) ×1 IMPLANT
SUT MONOCRYL 5-0 (SUTURE) ×2
SUT PROLENE 6 0 BV (SUTURE) ×24 IMPLANT
SUT PROLENE 7 0 BV 1 (SUTURE) ×24 IMPLANT
SUT SILK 2 0 (SUTURE) ×2
SUT SILK 2-0 18XBRD TIE 12 (SUTURE) ×1 IMPLANT
SUT SILK 3 0 (SUTURE) ×2
SUT SILK 3-0 18XBRD TIE 12 (SUTURE) ×1 IMPLANT
SUT SILK 4 0 (SUTURE) ×2
SUT SILK 4-0 18XBRD TIE 12 (SUTURE) ×1 IMPLANT
SUT VIC AB 3-0 SH 27 (SUTURE) ×4
SUT VIC AB 3-0 SH 27X BRD (SUTURE) ×2 IMPLANT
SYR 10ML LL (SYRINGE) ×3 IMPLANT
SYR 20ML LL LF (SYRINGE) ×3 IMPLANT
SYR 3ML LL SCALE MARK (SYRINGE) ×3 IMPLANT
TRAY FOLEY MTR SLVR 16FR STAT (SET/KITS/TRAYS/PACK) ×3 IMPLANT
TUBING CONNECTING 10 (TUBING) IMPLANT
TUBING CONNECTING 10' (TUBING)

## 2019-08-09 NOTE — Progress Notes (Signed)
Pt received to room ICU 18 from PACU  s/p Carotid Endarterectomy. Pt alert and oriented. Speech clear. Denies pain or discomfort. Pt oriented to room. Incision right neck well approximated with no redness, swelling, drainage noted.

## 2019-08-09 NOTE — Transfer of Care (Signed)
Immediate Anesthesia Transfer of Care Note  Patient: Gregory Stark  Procedure(s) Performed: ENDARTERECTOMY CAROTID (Right Neck)  Patient Location: PACU  Anesthesia Type:General  Level of Consciousness: awake, oriented and drowsy  Airway & Oxygen Therapy: Patient Spontanous Breathing and Patient connected to face mask oxygen  Post-op Assessment: Report given to RN, Post -op Vital signs reviewed and stable and Patient moving all extremities X 4  Post vital signs: Reviewed and stable  Last Vitals:  Vitals Value Taken Time  BP 119/79 08/09/19 1131  Temp    Pulse 76 08/09/19 1134  Resp 18 08/09/19 1134  SpO2 100 % 08/09/19 1134  Vitals shown include unvalidated device data.  Last Pain:  Vitals:   08/09/19 [id number]  TempSrc: Tympanic  PainSc: 0-No pain         Complications: No apparent anesthesia complications

## 2019-08-09 NOTE — Progress Notes (Signed)
[place] visited pt. while rounding in ICU; pt. awake and lying in bed.  Shared he had just had surgery to repair an 80% blockage of one of his coronary arteries; tearful in describing gratitude to his MD for identifying blockage and 'adding 10 years to my life'.  Pt. shared is [place] and attends [employer] in [place]; pastor is aware of pt.'s admission to [facility].  Pt.'s faith gives him a sense of purpose --to serve those around him.  Pt.'s wife has just been diagnosed w/non-Hodkin's Lymphoma; shared she is 'afraid of dying'.  Pt. 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  Pt. anticipates discharge tomorrow and claimed he is not in any pain at this time.  No other needs expressed.      08/09/19 1600  Clinical Encounter Type  Visited With Patient;Health care provider  Visit Type Initial;Psychological support;Social support;Spiritual support;Critical Care  Referral From  (RT)  Spiritual Encounters  Spiritual Needs Emotional  Stress Factors  Patient Stress Factors Major life changes

## 2019-08-09 NOTE — H&P (Signed)
 [facility] History & Physical Update  The patient was interviewed and re-examined.  The patient's previous History and Physical has been reviewed and is unchanged.  There is no change in the plan of care. We plan to proceed with the scheduled procedure.  Hortencia Pilar, MD  08/09/2019, 8:20 AM

## 2019-08-09 NOTE — Progress Notes (Signed)
Pt resting in bed with no acute distress. Denies pain or discomfort.  

## 2019-08-09 NOTE — Anesthesia Procedure Notes (Signed)
Procedure Name: Intubation Date/Time: 08/09/2019 8:41 AM Performed by: Lia Foyer, CRNA Pre-anesthesia Checklist: Patient identified, Emergency Drugs available, Suction available and Patient being monitored Patient Re-evaluated:Patient Re-evaluated prior to induction Oxygen Delivery Method: Circle system utilized Preoxygenation: Pre-oxygenation with 100% oxygen Induction Type: IV induction Ventilation: Mask ventilation without difficulty and Oral airway inserted - appropriate to patient size Laryngoscope Size: McGraph and 4 Grade View: Grade I Tube type: Oral Tube size: 7.5 mm Number of attempts: 1 Airway Equipment and Method: Stylet and Oral airway Placement Confirmation: ETT inserted through vocal cords under direct vision,  positive ETCO2 and breath sounds checked- equal and bilateral Secured at: 22 cm Tube secured with: Tape Dental Injury: Teeth and Oropharynx as per pre-operative assessment

## 2019-08-09 NOTE — Anesthesia Preprocedure Evaluation (Signed)
Anesthesia Evaluation  Patient identified by MRN, date of birth, ID band Patient awake    Reviewed: Allergy & Precautions, NPO status , Patient's Chart, lab work & pertinent test results  History of Anesthesia Complications Negative for: history of anesthetic complications  Airway Mallampati: III  TM Distance: >3 FB Neck ROM: Full    Dental  (+) Missing   Pulmonary neg pulmonary ROS, neg sleep apnea, neg COPD,    breath sounds clear to auscultation- rhonchi (-) wheezing      Cardiovascular Exercise Tolerance: Good hypertension, Pt. on medications (-) angina+ Peripheral Vascular Disease  (-) CAD, (-) Past MI, (-) Cardiac Stents and (-) CABG  Rhythm:Regular Rate:Normal - Systolic murmurs and - Diastolic murmurs    Neuro/Psych neg Seizures negative neurological ROS  negative psych ROS   GI/Hepatic Neg liver ROS, GERD  ,  Endo/Other  negative endocrine ROSneg diabetes  Renal/GU negative Renal ROS     Musculoskeletal  (+) Arthritis ,   Abdominal (+) + obese,   Peds  Hematology negative hematology ROS (+)   Anesthesia Other Findings Past Medical History: No date: Cancer ([place])     Comment:  chest No date: Gastroesophageal reflux disease No date: Hyperlipidemia No date: Hypertension No date: Peripheral vascular disease (HCC)   Reproductive/Obstetrics                             Anesthesia Physical Anesthesia Plan  ASA: II  Anesthesia Plan: General   Post-op Pain Management:    Induction: Intravenous  PONV Risk Score and Plan: 1 and Ondansetron and Dexamethasone  Airway Management Planned: Oral ETT  Additional Equipment: Arterial line  Intra-op Plan:   Post-operative Plan: Extubation in OR  Informed Consent: I have reviewed the patients History and Physical, chart, labs and discussed the procedure including the risks, benefits and alternatives for the proposed anesthesia with  the patient or authorized representative who has indicated his/her understanding and acceptance.     Dental advisory given  Plan Discussed with: CRNA and Anesthesiologist  Anesthesia Plan Comments:         Anesthesia Quick Evaluation

## 2019-08-09 NOTE — Progress Notes (Signed)
Pt sitting up in bed eating clear liquid diet. Tolerating well.

## 2019-08-09 NOTE — Anesthesia Postprocedure Evaluation (Signed)
Anesthesia Post Note  Patient: Kristian Covey  Procedure(s) Performed: ENDARTERECTOMY CAROTID (Right Neck)  Patient location during evaluation: PACU Anesthesia Type: General Level of consciousness: awake and alert and oriented Pain management: pain level controlled Vital Signs Assessment: post-procedure vital signs reviewed and stable Respiratory status: spontaneous breathing, nonlabored ventilation and respiratory function stable Cardiovascular status: blood pressure returned to baseline and stable Postop Assessment: no signs of nausea or vomiting Anesthetic complications: no     Last Vitals:  Vitals:   08/09/19 1256 08/09/19 1304  BP:  113/60  Pulse: 63 77  Resp: 16 20  Temp:    SpO2: 98% 94%    Last Pain:  Vitals:   08/09/19 1130  TempSrc:   PainSc: 0-No pain                 Marques Ericson

## 2019-08-09 NOTE — Anesthesia Procedure Notes (Signed)
Arterial Line Insertion Start/End2/09/2019 8:40 AM, 08/09/2019 8:45 AM Performed by: Emmie Niemann, MD, Harlow Ohms, RN, anesthesiologist  Left, radial was placed Catheter size: 20 G Hand hygiene performed  and Seldinger technique used Allen's test indicative of satisfactory collateral circulation Attempts: 1 Procedure performed without using ultrasound guided technique. Following insertion, dressing applied. Patient tolerated the procedure well with no immediate complications.

## 2019-08-09 NOTE — Op Note (Signed)
[facility]   OPERATIVE NOTE  PROCEDURE:   1.  Right carotid endarterectomy with CorMatrix arterial patch reconstruction  PRE-OPERATIVE DIAGNOSIS: 1.  Critical carotid stenosis   POST-OPERATIVE DIAGNOSIS: same as above   SURGEON: Katha Cabal, MD  ASSISTANT(S): Ms. Hezzie Bump  ANESTHESIA: general  ESTIMATED BLOOD LOSS: 50 cc  FINDING(S): 1.  Extensive calcified carotid plaque.  SPECIMEN(S):  Carotid plaque (sent to Pathology)  INDICATIONS:   LUMEN GIRARD is a 79 y.o. y.o. male who presents with right carotid stenosis of 80%.  The risks, benefits, and alternatives to carotid endarterectomy were discussed with the patient. The differences between carotid stenting and carotid endarterectomy were reviewed.  The patient voiced understanding and appears to be aware that the risks of carotid endarterectomy include but are not limited to: bleeding, infection, stroke, myocardial infarction, death, cranial nerve injuries both temporary and permanent, neck hematoma, possible airway compromise, labile blood pressure post-operatively, cerebral hyperperfusion syndrome, and possible need for additional interventions in the future. The patient is aware of the risks and agrees to proceed forward with the procedure.  DESCRIPTION: After full informed written consent was obtained from the patient, the patient was brought back to the operating room and placed supine upon the operating table.  Prior to induction, the patient received IV antibiotics.  After obtaining adequate anesthesia, the patient was placed a supine position with a shoulder roll in place and the patient's neck slightly hyperextended and rotated away from the surgical site.  The patient was prepped in the standard fashion for a carotid endarterectomy.    A first assistant was required to provide a safe and appropriate environment for executing the surgery.  The assistant was integral in providing  retraction, exposure, running suture providing suction and in the closing process.  The incision was made anterior to the sternocleidomastoid muscle and dissected down through the subcutaneous tissue.  The platysmas was opened with electrocautery.  The internal jugular vein and facial vein were identified.  The facial vein is ligated and divided between 2-0 silk ties.  The omohyoid was identified in the common carotid artery exposed at this level. The dissection was there in carried out along the carotid artery in a cranial direction.  The dissection was then carried along periadventitial plane along the common carotid artery up to the bifurcation. The external carotid artery was identified. Vessel loops were then placed around the external carotid artery as well as the superior thyroid artery. In the process of this dissection, the hypoglossal nerve was identified and protected from harm.  The internal carotid artery was then dissected circumferentially just beyond an area in the internal carotid artery distal to the plaque.    At this point, we gave the patient 7000 units of intravenous heparin.  After this was allowed to circulate for several minutes, the common carotid followed by the external carotid and then the internal carotid artery were clamped.  Arteriotomy was made in the common carotid artery with a 11 blade, and extended the arteriotomy with a Potts scissor down into the common carotid artery, then the arteriotomy was carried through the bifurcation into the internal carotid artery until I reached an area that was not diseased.  At this point, a Sundt shunt was placed.  The endarterectomy was begun in the common carotid artery with a Garment/textile technologist and carried this dissection down into the common carotid artery circumferentially.  Then I transected the plaque at a segment where it was adherent  and transected the plaque with Potts scissors.  I then carried this dissection up into the external  carotid artery.  The plaque was extracted by unclamping the external carotid artery and performing an eversion endarterectomy.  The dissection was then carried into the internal carotid artery where a  feathered end point was created.  The plaque was passed off the field as a specimen.  The distal endpoint was tacked down with 6 interrupted 7-0 Prolene sutures.  A CorMatrix arterial patch was delivered onto the field and trimmed appropriately for the artery and sewed in place with 6-0 Prolene using a 4 quadrant technique.  The medial suture line was completed and the lateral suture line was run approximately one quarter the length of the arteriotomy.  Prior to completing this patch angioplasty, the shunt was removed, the internal carotid artery was flushed and there was excellent backbleeding.  The carotid artery repair was flushed with heparinized saline and then the patch angioplasty was completed in the usual fashion.  The flow was then reestablished first to the external carotid artery and then the internal carotid artery to prevent distal embolization.   Several minutes of pressure were held and 6-0 Prolene patch sutures were used as need for hemostasis.  At this point, I placed Surgicel and Evicel topical hemostatic agents.  There was no more active bleeding in the surgical site.  The sternocleidomastoid space was closed with three interrupted 3-0 Vicryl sutures. I then reapproximated the platysma muscle with a running stitch of 3-0 Vicryl.  The skin was then closed with a running subcuticular 4-0 Monocryl.  The skin was then cleaned, dried and Dermabond was used to reinforce the skin closure.  The patient awakened and was taken to the recovery room in stable condition, following commands and moving all four extremities without any apparent deficits.    COMPLICATIONS: none  CONDITION: stable  Hortencia Pilar 08/09/2019<11:25 AM

## 2019-08-10 LAB — BASIC METABOLIC PANEL
Anion gap: 6 (ref 5–15)
BUN: 16 mg/dL (ref 8–23)
CO2: 29 mmol/L (ref 22–32)
Calcium: 9.2 mg/dL (ref 8.9–10.3)
Chloride: 105 mmol/L (ref 98–111)
Creatinine, Ser: 1.24 mg/dL (ref 0.61–1.24)
GFR calc Af Amer: >60 mL/min (ref >60)
GFR calc non Af Amer: 55 mL/min — ABNORMAL LOW (ref >60)
Glucose, Bld: 147 mg/dL — ABNORMAL HIGH (ref 70–99)
Potassium: 5.3 mmol/L — ABNORMAL HIGH (ref 3.5–5.1)
Sodium: 140 mmol/L (ref 135–145)

## 2019-08-10 LAB — CBC
HCT: 46.6 % (ref 39.0–52.0)
Hemoglobin: 15.7 g/dL (ref 13.0–17.0)
MCH: 31.7 pg (ref 26.0–34.0)
MCHC: 33.7 g/dL (ref 30.0–36.0)
MCV: 94.1 fL (ref 80.0–100.0)
Platelets: 201 10*3/uL (ref 150–400)
RBC: 4.95 MIL/uL (ref 4.22–5.81)
RDW: 12.9 % (ref 11.5–15.5)
WBC: 19.3 10*3/uL — ABNORMAL HIGH (ref 4.0–10.5)
nRBC: 0 % (ref 0.0–0.2)

## 2019-08-10 LAB — SURGICAL PATHOLOGY

## 2019-08-10 NOTE — Progress Notes (Signed)
Patient has ambulated with steady gait ober 18ft.  Patient has also voided 286ml of urine post foley removal.

## 2019-08-10 NOTE — Anesthesia Postprocedure Evaluation (Signed)
Anesthesia Post Note  Patient: Kristian Covey  Procedure(s) Performed: ENDARTERECTOMY CAROTID (Right Neck)  Patient location during evaluation: ICU Anesthesia Type: General Level of consciousness: awake, awake and alert and oriented Pain management: pain level controlled Vital Signs Assessment: post-procedure vital signs reviewed and stable Respiratory status: spontaneous breathing, nonlabored ventilation and respiratory function stable Cardiovascular status: blood pressure returned to baseline and stable Postop Assessment: no headache and no backache Anesthetic complications: no     Last Vitals:  Vitals:   08/10/19 0735 08/10/19 0800  BP: (!) 120/94   Pulse: 67   Resp: (!) 26   Temp:  36.7 C  SpO2: 94%     Last Pain:  Vitals:   08/10/19 0825  TempSrc:   PainSc: 0-No pain                 Johnna Acosta

## 2019-08-10 NOTE — Discharge Instructions (Signed)
1) you may shower as of tomorrow.  Gently clean your incision.  Gently pat dry.  Dermabond (glue on top of the incision) to fall off on its own. 2) no driving until you are cleared during your first postop visit. 3) no strenuous activity or heavy lifting greater than 10 pounds and to you are cleared during your first postop visit. 4) please take your blood pressure about 3 times a day and keep a log.  If you notice your blood pressure is greater than 160 please reach out to either Dr. Doy Hutching or office.

## 2019-08-10 NOTE — Addendum Note (Signed)
Addendum  created 08/10/19 0936 by Johnna Acosta, CRNA   Clinical Note Signed

## 2019-08-10 NOTE — Progress Notes (Signed)
Patient ambulated to exit to be taken home by wife in personal vehicle.

## 2019-08-10 NOTE — Discharge Summary (Signed)
[place]    Discharge Summary  Patient ID:  Gregory Stark MRN: [id number] DOB/AGE: 1941-05-26 79 y.o.  Admit date: 08/09/2019 Discharge date: 08/10/2019 Date of Surgery: 08/09/2019 Surgeon: Surgeon(s): Schnier, Dolores Lory, MD  Admission Diagnosis: Carotid stenosis, asymptomatic, right [I65.21]  Discharge Diagnoses:  Carotid stenosis, asymptomatic, right [I65.21]  Secondary Diagnoses: Past Medical History:  Diagnosis Date  . Cancer ([place])    chest  . Gastroesophageal reflux disease   . Hyperlipidemia   . Hypertension   . Peripheral vascular disease ([place])    Procedure(s): ENDARTERECTOMY CAROTID  Discharged Condition: Good  HPI / Hospital Course:  Gregory Stark is a 79 y.o. y.o. male who presents with right carotid stenosis of 80%.  The risks, benefits, and alternatives to carotid endarterectomy were discussed with the patient. The differences between carotid stenting and carotid endarterectomy were reviewed.  The patient voiced understanding and appears to be aware that the risks of carotid endarterectomy include but are not limited to: bleeding, infection, stroke, myocardial infarction, death, cranial nerve injuries both temporary and permanent, neck hematoma, possible airway compromise, labile blood pressure post-operatively, cerebral hyperperfusion syndrome, and possible need for additional interventions in the future. The patient is aware of the risks and agrees to proceed forward with the procedure. On 08/09/19, the patient underwent:  1.  Right carotid endarterectomy with CorMatrix arterial patch reconstruction  The patient tolerated the procedure well was transferred from the recovery room to the ICU for observation overnight.  During the brief inpatient stay, the patient's diet was advanced, his Foley was removed, his pain was treated to the use of p.o. pain medication and he was ambulating at baseline.  The patient's that of surgery was  unremarkable.  Day of discharge, the patient was afebrile with stable vital signs, urinating independently with an essentially unremarkable physical exam.  Extubated: POD # 0 Physical exam:  Alert and oriented x3, no acute distress Face: Symmetrical Neck: Trachea midline  Incision: Dermabond.  Clean dry intact. Cardiovascular: Regular rate and rhythm Pulmonary: Clear to auscultation bilaterally Abdomen: Soft, nontender, nondistended positive bowel sounds Extremities: Bilaterally, warm distally toes.  Minimal edema.  Labs as below  Complications: none  Consults: None  Significant Diagnostic Studies: CBC Lab Results  Component Value Date   WBC 19.3 (H) 08/10/2019   HGB 15.7 08/10/2019   HCT 46.6 08/10/2019   MCV 94.1 08/10/2019   PLT 201 08/10/2019   BMET    Component Value Date/Time   NA 140 08/10/2019 0340   K 5.3 (H) 08/10/2019 0340   CL 105 08/10/2019 0340   CO2 29 08/10/2019 0340   GLUCOSE 147 (H) 08/10/2019 0340   BUN 16 08/10/2019 0340   CREATININE 1.24 08/10/2019 0340   CALCIUM 9.2 08/10/2019 0340   GFRNONAA 55 (L) 08/10/2019 0340   GFRAA >60 08/10/2019 0340   COAG Lab Results  Component Value Date   INR 1.0 08/08/2019   Disposition:  Discharge to :Home  Allergies as of 08/10/2019   No Known Allergies     Medication List    TAKE these medications   aspirin EC 81 MG tablet Take 81 mg by mouth daily.   lisinopril 10 MG tablet Commonly known as: ZESTRIL Take 10 mg by mouth at bedtime.   pravastatin 40 MG tablet Commonly known as: PRAVACHOL Take 40 mg by mouth at bedtime.   Vitamin D (Ergocalciferol) 1.25 MG (50000 UNIT) Caps capsule Commonly known as: DRISDOL Take 50,000 Units by mouth every  7 (seven) days.      Verbal and written Discharge instructions given to the patient. Wound care per Discharge AVS Follow-up Information    Kris Hartmann, NP Follow up.   Specialty: Vascular Surgery Why: First post-op check. Incision check. No  studies needed.  Contact information: [place] [id number] [phone]          Signed: Sela Hua, PA-C  08/10/2019, 11:17 AM

## 2019-08-14 ENCOUNTER — Encounter (INDEPENDENT_AMBULATORY_CARE_PROVIDER_SITE_OTHER): Payer: Self-pay

## 2019-08-17 ENCOUNTER — Other Ambulatory Visit: Payer: Self-pay

## 2019-08-17 ENCOUNTER — Encounter (INDEPENDENT_AMBULATORY_CARE_PROVIDER_SITE_OTHER): Payer: Self-pay | Admitting: Nurse Practitioner

## 2019-08-17 ENCOUNTER — Ambulatory Visit (INDEPENDENT_AMBULATORY_CARE_PROVIDER_SITE_OTHER): Payer: Medicare Other | Admitting: Nurse Practitioner

## 2019-08-17 VITALS — BP 148/80 | HR 45 | Resp 12 | Ht 68.0 in | Wt 195.0 lb

## 2019-08-17 DIAGNOSIS — I6521 Occlusion and stenosis of right carotid artery: Secondary | ICD-10-CM

## 2019-08-17 DIAGNOSIS — I10 Essential (primary) hypertension: Secondary | ICD-10-CM

## 2019-08-17 NOTE — Progress Notes (Signed)
SUBJECTIVE:  Patient ID: Gregory Stark, male    DOB: June 29, 1941, 79 y.o.   MRN: [id number] Chief Complaint  Patient presents with  . Follow-up    incision check    HPI  Gregory Stark is a 79 y.o. male presents today for a postsurgical follow-up following right carotid endarterectomy on 08/09/2019.  Initially the patient had some swelling, sore throat and numbness of his face.  Today the swelling has decreased greatly.  The patient still continues to endorse some numbness along his mouth area, cheek and ear.  This numbness also affects his inner mouth area however it does not affect his ability to chew and swallow safely.  Currently the patient is only utilizing Tylenol for discomfort.  He denies any true pain.  Overall the patient states that he is doing well and feeling well after his procedure.   Past Medical History:  Diagnosis Date  . Cancer ([place])    chest  . Gastroesophageal reflux disease   . Hyperlipidemia   . Hypertension   . Peripheral vascular disease [facility])     Past Surgical History:  Procedure Laterality Date  . arthroscoppy knee Left   . CATARACT EXTRACTION, BILATERAL    . ENDARTERECTOMY Right 08/09/2019   Procedure: ENDARTERECTOMY CAROTID;  Surgeon: Katha Cabal, MD;  Location: [facility] ORS;  Service: Vascular;  Laterality: Right;    Social History   Socioeconomic History  . Marital status: Married    Spouse name: Not on file  . Number of children: Not on file  . Years of education: Not on file  . Highest education level: Not on file  Occupational History  . Not on file  Tobacco Use  . Smoking status: Never Smoker  . Smokeless tobacco: Never Used  Substance and Sexual Activity  . Alcohol use: Yes    Alcohol/week: 4.0 standard drinks    Types: 3 Shots of liquor, 1 Glasses of wine per week    Comment: socially  . Drug use: Never  . Sexual activity: Not on file  Other Topics Concern  . Not on file  Social History Narrative  . Not on file    Social Determinants of Health   Financial Resource Strain:   . Difficulty of Paying Living Expenses: Not on file  Food Insecurity:   . Worried About Charity fundraiser in the Last Year: Not on file  . Ran Out of Food in the Last Year: Not on file  Transportation Needs:   . Lack of Transportation (Medical): Not on file  . Lack of Transportation (Non-Medical): Not on file  Physical Activity:   . Days of Exercise per Week: Not on file  . Minutes of Exercise per Session: Not on file  Stress:   . Feeling of Stress : Not on file  Social Connections:   . Frequency of Communication with Friends and Family: Not on file  . Frequency of Social Gatherings with Friends and Family: Not on file  . Attends Religious Services: Not on file  . Active Member of Clubs or Organizations: Not on file  . Attends Archivist Meetings: Not on file  . Marital Status: Not on file  Intimate Partner Violence:   . Fear of Current or Ex-Partner: Not on file  . Emotionally Abused: Not on file  . Physically Abused: Not on file  . Sexually Abused: Not on file    History reviewed. No pertinent family history.  No Known Allergies   Review  of Systems   Review of Systems: Negative Unless Checked Constitutional: [] Weight loss  [] Fever  [] Chills Cardiac: [] Chest pain   []  Atrial Fibrillation  [] Palpitations   [] Shortness of breath when laying flat   [] Shortness of breath with exertion. [] Shortness of breath at rest Vascular:  [] Pain in legs with walking   [] Pain in legs with standing [] Pain in legs when laying flat   [] Claudication    [] Pain in feet when laying flat    [] History of DVT   [] Phlebitis   [] Swelling in legs   [] Varicose veins   [] Non-healing ulcers Pulmonary:   [] Uses home oxygen   [] Productive cough   [] Hemoptysis   [] Wheeze  [] COPD   [] Asthma Neurologic:  [] Dizziness   [] Seizures  [] Blackouts [] History of stroke   [] History of TIA  [] Aphasia   [] Temporary Blindness   [] Weakness or  numbness in arm   [] Weakness or numbness in leg Musculoskeletal:   [] Joint swelling   [] Joint pain   [] Low back pain  []  History of Knee Replacement [x] Arthritis [] back Surgeries  []  Spinal Stenosis    Hematologic:  [] Easy bruising  [] Easy bleeding   [] Hypercoagulable state   [] Anemic Gastrointestinal:  [] Diarrhea   [] Vomiting  [] Gastroesophageal reflux/heartburn   [] Difficulty swallowing. [] Abdominal pain Genitourinary:  [] Chronic kidney disease   [] Difficult urination  [] Anuric   [] Blood in urine [] Frequent urination  [] Burning with urination   [] Hematuria Skin:  [] Rashes   [] Ulcers [x] Wounds Psychological:  [] History of anxiety   []  History of major depression  []  Memory Difficulties      OBJECTIVE:   Physical Exam  BP (!) 148/80 (BP Location: Right Arm)   Pulse (!) 45   Resp 12   Ht 5\' 8"  (1.727 m)   Wt 195 lb (88.5 kg)   BMI 29.65 kg/m   Gen: WD/WN, NAD Head: [place]/AT, No temporalis wasting.  Ear/Nose/Throat: Hearing grossly intact, nares w/o erythema or drainage Eyes: PER, EOMI, sclera nonicteric.  Neck: Supple, no masses.  No JVD.  Pulmonary:  Good air movement, no use of accessory muscles.  Cardiac: RRR Vascular:  No bruit.  Well approximated incision right neck Vessel Right Left  Radial Palpable Palpable  Dorsalis Pedis Palpable Palpable  Posterior Tibial Palpable Palpable   Gastrointestinal: soft, non-distended. No guarding/no peritoneal signs.  Musculoskeletal: M/S 5/5 throughout.  No deformity or atrophy.  Neurologic: Pain and light touch intact in extremities.  Symmetrical.  Speech is fluent. Motor exam as listed above.  Tongue midline Psychiatric: Judgment intact, Mood & affect appropriate for pt's clinical situation. Dermatologic: No Venous rashes. No Ulcers Noted.  No changes consistent with cellulitis. Lymph : No Cervical lymphadenopathy, no lichenification or skin changes of chronic lymphedema.       ASSESSMENT AND PLAN:  1. Carotid stenosis, asymptomatic,  right Overall the patient is doing well following his procedure.  The numbness around his mouth area is decreasing.  Numbness along the side of his face is common.  Somewhat less common is numbness of the ear.  The patient continues to have discomfort and/or worsening discomfort in the ear area or worsening numbness in his mouth we may consider referral to ENT for further evaluation.  Otherwise patient will follow up in 4 to 6 weeks with carotid duplex.  2. HTN, goal below 140/80 Today the patient's blood pressure was slightly elevated however previous blood pressure readings today were in the [id number] systolic.  This is likely an isolated event.  Patient on appropriate medications currently.  Will continue to follow with primary care for hypertensive management.   Current Outpatient Medications on File Prior to Visit  Medication Sig Dispense Refill  . aspirin 81 MG EC tablet Take by mouth.    Marland Kitchen aspirin EC 81 MG tablet Take 81 mg by mouth daily.    . Cholecalciferol 125 MCG (5000 UT) capsule Take by mouth.    Marland Kitchen lisinopril (ZESTRIL) 10 MG tablet Take 10 mg by mouth at bedtime.     . pravastatin (PRAVACHOL) 40 MG tablet Take 40 mg by mouth at bedtime.     . Vitamin D, Ergocalciferol, (DRISDOL) 1.25 MG (50000 UT) CAPS capsule Take 50,000 Units by mouth every 7 (seven) days.     No current facility-administered medications on file prior to visit.    There are no Patient Instructions on file for this visit. No follow-ups on file.   Kris Hartmann, NP  This note was completed with Sales executive.  Any errors are purely unintentional.

## 2019-09-18 ENCOUNTER — Other Ambulatory Visit (INDEPENDENT_AMBULATORY_CARE_PROVIDER_SITE_OTHER): Payer: Self-pay | Admitting: Vascular Surgery

## 2019-09-18 DIAGNOSIS — Z9889 Other specified postprocedural states: Secondary | ICD-10-CM

## 2019-09-19 NOTE — Progress Notes (Signed)
Patient ID: Gregory Stark, male   DOB: 1941/04/19, 79 y.o.   MRN: [id number]  No chief complaint on file.   HPI Gregory Stark is a 79 y.o. male.    The patient is seen for follow up evaluation of carotid stenosis status post right carotid endarterectomy with CorMatrix arterial patch reconstruction on 08/09/2019.  There were no post operative problems or complications related to the surgery.  The patient denies neck or incisional pain.  He does note numbness around the ear and a brief discomfort when he chews on the right side  The patient denies interval amaurosis fugax. There is no recent history of TIA symptoms or focal motor deficits. There is no prior documented CVA.  The patient denies headache.  The patient is taking enteric-coated aspirin 81 mg daily.  The patient has a history of coronary artery disease, no recent episodes of angina or shortness of breath. The patient denies PAD or claudication symptoms. There is a history of hyperlipidemia which is being treated with a statin.   Duplex ultrasound of the carotid arteries shows less than 40%, widely patent right carotid endarterectomy site and 1 to 39% stenosis of the left internal carotid artery  Past Medical History:  Diagnosis Date  . Cancer ([place])    chest  . Gastroesophageal reflux disease   . Hyperlipidemia   . Hypertension   . Peripheral vascular disease [facility])     Past Surgical History:  Procedure Laterality Date  . arthroscoppy knee Left   . CATARACT EXTRACTION, BILATERAL    . ENDARTERECTOMY Right 08/09/2019   Procedure: ENDARTERECTOMY CAROTID;  Surgeon: Katha Cabal, MD;  Location: [facility] ORS;  Service: Vascular;  Laterality: Right;      No Known Allergies  Current Outpatient Medications  Medication Sig Dispense Refill  . aspirin 81 MG EC tablet Take by mouth.    Marland Kitchen aspirin EC 81 MG tablet Take 81 mg by mouth daily.    . Cholecalciferol 125 MCG (5000 UT) capsule Take by mouth.    Marland Kitchen lisinopril  (ZESTRIL) 10 MG tablet Take 10 mg by mouth at bedtime.     . pravastatin (PRAVACHOL) 40 MG tablet Take 40 mg by mouth at bedtime.     . Vitamin D, Ergocalciferol, (DRISDOL) 1.25 MG (50000 UT) CAPS capsule Take 50,000 Units by mouth every 7 (seven) days.     No current facility-administered medications for this visit.        Physical Exam There were no vitals taken for this visit. Gen:  WD/WN, NAD Skin: incision C/D/I neurologically motor is intact smile is symmetric he does have some right tongue deviation     Assessment/Plan:  1. Bilateral carotid artery stenosis Recommend:  The patient is s/p successful right CEA  Duplex ultrasound preoperatively shows <40% contralateral stenosis.  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  My hope is this will resolve with time and is secondary to contusion much like the numbness around the ear my hope is that this will essentially resolve with time as is typically seen with most carotid endarterectomies.  - VAS US CAROTID; Future  2. Mixed hyperlipidemia Continue statin as ordered and reviewed, no changes at this time  3. Gastroesophageal reflux disease without esophagitis Continue PPI as already ordered, this medication has been reviewed and there are no changes at this time.  Avoidence of caffeine and alcohol  Moderate elevation of the head of the bed   4. Primary osteoarthritis of left knee Continue NSAID medications as already ordered, these medications have been reviewed and there are no changes at this time.  Continued activity and therapy was stressed.   5. HTN, goal below 140/80 Continue antihypertensive medications as already  ordered, these medications have been reviewed and there are no changes at this time.       Hortencia Pilar 09/19/2019, 4:46 PM   This note was created with Dragon medical transcription system.  Any errors from dictation are unintentional.

## 2019-09-21 ENCOUNTER — Other Ambulatory Visit: Payer: Self-pay

## 2019-09-21 ENCOUNTER — Encounter (INDEPENDENT_AMBULATORY_CARE_PROVIDER_SITE_OTHER): Payer: Self-pay | Admitting: Vascular Surgery

## 2019-09-21 ENCOUNTER — Ambulatory Visit (INDEPENDENT_AMBULATORY_CARE_PROVIDER_SITE_OTHER): Payer: Medicare Other

## 2019-09-21 ENCOUNTER — Ambulatory Visit (INDEPENDENT_AMBULATORY_CARE_PROVIDER_SITE_OTHER): Payer: Medicare Other | Admitting: Vascular Surgery

## 2019-09-21 VITALS — BP 114/78 | HR 103 | Resp 16 | Wt 202.0 lb

## 2019-09-21 DIAGNOSIS — I10 Essential (primary) hypertension: Secondary | ICD-10-CM

## 2019-09-21 DIAGNOSIS — I6523 Occlusion and stenosis of bilateral carotid arteries: Secondary | ICD-10-CM

## 2019-09-21 DIAGNOSIS — I6521 Occlusion and stenosis of right carotid artery: Secondary | ICD-10-CM

## 2019-09-21 DIAGNOSIS — K219 Gastro-esophageal reflux disease without esophagitis: Secondary | ICD-10-CM

## 2019-09-21 DIAGNOSIS — Z9889 Other specified postprocedural states: Secondary | ICD-10-CM | POA: Diagnosis not present

## 2019-09-21 DIAGNOSIS — E782 Mixed hyperlipidemia: Secondary | ICD-10-CM

## 2019-09-21 DIAGNOSIS — M1712 Unilateral primary osteoarthritis, left knee: Secondary | ICD-10-CM

## 2019-09-22 ENCOUNTER — Encounter (INDEPENDENT_AMBULATORY_CARE_PROVIDER_SITE_OTHER): Payer: Self-pay

## 2019-10-05 ENCOUNTER — Ambulatory Visit (INDEPENDENT_AMBULATORY_CARE_PROVIDER_SITE_OTHER): Payer: Medicare Other | Admitting: Dermatology

## 2019-10-05 ENCOUNTER — Other Ambulatory Visit: Payer: Self-pay

## 2019-10-05 DIAGNOSIS — Z1283 Encounter for screening for malignant neoplasm of skin: Secondary | ICD-10-CM

## 2019-10-05 DIAGNOSIS — L570 Actinic keratosis: Secondary | ICD-10-CM

## 2019-10-05 DIAGNOSIS — Z85828 Personal history of other malignant neoplasm of skin: Secondary | ICD-10-CM

## 2019-10-05 DIAGNOSIS — Q828 Other specified congenital malformations of skin: Secondary | ICD-10-CM

## 2019-10-05 DIAGNOSIS — L821 Other seborrheic keratosis: Secondary | ICD-10-CM

## 2019-10-05 DIAGNOSIS — L814 Other melanin hyperpigmentation: Secondary | ICD-10-CM

## 2019-10-05 DIAGNOSIS — D1800 Hemangioma unspecified site: Secondary | ICD-10-CM

## 2019-10-05 DIAGNOSIS — L578 Other skin changes due to chronic exposure to nonionizing radiation: Secondary | ICD-10-CM

## 2019-10-05 DIAGNOSIS — D229 Melanocytic nevi, unspecified: Secondary | ICD-10-CM

## 2019-10-05 DIAGNOSIS — D239 Other benign neoplasm of skin, unspecified: Secondary | ICD-10-CM

## 2019-10-05 DIAGNOSIS — L905 Scar conditions and fibrosis of skin: Secondary | ICD-10-CM

## 2019-10-05 NOTE — Progress Notes (Signed)
   Follow-Up Visit   Subjective  Gregory Stark is a 79 y.o. male who presents for the following: Annual Exam (TBSE, left face, left wrist since last visit).  Patient is here for skin cancer screening and mole check.   The following portions of the chart were reviewed this encounter and updated as appropriate: Tobacco  Allergies  Meds  Problems  Med Hx  Surg Hx  Fam Hx      Review of Systems: No other skin or systemic complaints.  Objective  Well appearing patient in no apparent distress; mood and affect are within normal limits.  A full examination was performed including scalp, head, eyes, ears, nose, lips, neck, chest, axillae, abdomen, back, buttocks, bilateral upper extremities, bilateral lower extremities, hands, feet, fingers, toes, fingernails, and toenails. All findings within normal limits unless otherwise noted below.  Objective  Dorsum of Nose, Left Forehead, Left Zygomatic Area, Right Anterior Mandible, Right Forearm - Posterior (3), Right Nasal bridge: Erythematous thin papules/macules with gritty scale.   Objective  Right Chest: Well healed scar with no evidence of recurrence.   Objective  Right  Neck: Well healed scar   Assessment & Plan  AK (actinic keratosis) (8) Right Forearm - Posterior (3); Dorsum of Nose; Right Nasal bridge; Left Zygomatic Area; Right Anterior Mandible; Left Forehead  Destruction of lesion - Dorsum of Nose, Left Forehead, Left Zygomatic Area, Right Anterior Mandible, Right Forearm - Posterior, Right Nasal bridge  Destruction method: cryotherapy   Informed consent: discussed and consent obtained   Lesion destroyed using liquid nitrogen: Yes   Outcome: patient tolerated procedure well with no complications   Post-procedure details: wound care instructions given    History of basal cell carcinoma (BCC) Right Chest  Clear. Observe for recurrence. Call clinic for new or changing lesions.  Recommend regular skin exams, daily  broad-spectrum spf 30+ sunscreen use, and photoprotection.     Porokeratosis Left Ankle - Posterior  Benign-appearing.  Observation.  Call clinic for new or changing moles.  Recommend daily use of broad spectrum spf 30+ sunscreen to sun-exposed areas.    Scar Right  Neck  Well healed scar from Carotid surgery in Aug 09 2019 Right carotid endarterectomy with CorMatrix arterial patch reconstruction   Hemangiomas - Red papules - Discussed benign nature - Observe - Call for any changes  Seborrheic Keratoses - Stuck-on, waxy, tan-brown papules and plaques  - Discussed benign etiology and prognosis. - Observe - Call for any changes  .Lentigines - Scattered tan macules - Discussed due to sun exposure - Benign, observe - Call for any changes  Melanocytic Nevi - Tan-brown and/or pink-flesh-colored symmetric macules and papules - Benign appearing on exam today - Observation - Call clinic for new or changing moles - Recommend daily use of broad spectrum spf 30+ sunscreen to sun-exposed areas.   Actinic Damage - diffuse scaly erythematous macules with underlying dyspigmentation - Recommend daily broad spectrum sunscreen SPF 30+ to sun-exposed areas, reapply every 2 hours as needed.  - Call for new or changing lesions.  Dermatofibroma - Firm pink/brown papulenodule with dimple sign - Benign appearing - Call for any changes  Skin cancer screening performed today.  Return in about 6 months (around 04/05/2020) for TBSE, [place] Follow up.   IDonzetta Kohut, CMA, am acting as scribe for Forest Gleason, MD .  Documentation: I have reviewed the above documentation for accuracy and completeness, and I agree with the above.  Forest Gleason, MD

## 2019-10-05 NOTE — Patient Instructions (Addendum)
Recommend daily broad spectrum sunscreen SPF 30+ to sun-exposed areas, reapply every 2 hours as needed. Call for new or changing lesions.  Recommend Nicotinamide 500mg twice per day to lower risk of non-melanoma skin cancer by approximately 25%.    

## 2019-10-17 ENCOUNTER — Encounter: Payer: Self-pay | Admitting: Dermatology

## 2020-01-02 ENCOUNTER — Ambulatory Visit: Payer: Medicare Other | Attending: Internal Medicine | Admitting: Speech Pathology

## 2020-01-02 ENCOUNTER — Other Ambulatory Visit: Payer: Self-pay

## 2020-01-02 DIAGNOSIS — R471 Dysarthria and anarthria: Secondary | ICD-10-CM

## 2020-01-03 ENCOUNTER — Encounter: Payer: Self-pay | Admitting: Speech Pathology

## 2020-01-03 NOTE — Therapy (Signed)
[place] MAIN [facility] SERVICES [address] [place], [place], [address] Phone: [phone]   Fax:  [phone]  Speech Language Pathology Evaluation  Patient Details  Name: Gregory Stark MRN: [id number] Date of Birth: 22-Sep-1940 Referring Provider (SLP): Gregory Stark   Encounter Date: 01/02/2020   End of Session - 01/03/20 0907    Visit Number 1    Number of Visits 25    Date for SLP Re-Evaluation 03/27/20    Authorization Type Medicare    Authorization Time Period 01/02/2020 thru 03/27/2020    Authorization - Visit Number 1    Progress Note Due on Visit 10    SLP Start Time 1100    SLP Stop Time  1200    SLP Time Calculation (min) 60 min    Activity Tolerance Patient tolerated treatment well           Past Medical History:  Diagnosis Date  . Actinic keratosis   . Basal cell carcinoma 03/23/2019   Right chest. Superficial and nodular patterns. Tx: EDC  . Cancer ([place])    chest  . Gastroesophageal reflux disease   . Hyperlipidemia   . Hypertension   . Peripheral vascular disease Gregory Stark)     Past Surgical History:  Procedure Laterality Date  . arthroscoppy knee Left   . CATARACT EXTRACTION, BILATERAL    . ENDARTERECTOMY Right 08/09/2019   Procedure: ENDARTERECTOMY CAROTID;  Surgeon: Katha Cabal, MD;  Location: [facility] ORS;  Service: Vascular;  Laterality: Right;    There were no vitals filed for this visit.   Subjective Assessment - 01/03/20 0856    Subjective pt pleasant, conversant, eager    Currently in Pain? No/denies              SLP Evaluation [facility] - 01/03/20 1245      SLP Visit Information   SLP Received On 01/03/20    Referring Provider (SLP) Gregory Stark    Onset Date 08/09/2019    Medical Diagnosis Speech Disturbance      Subjective   Subjective pt good historian, A&Ox4    Patient/Family Stated Goal for speech to return to "normal" especially "s" & "r"      General Information   HPI Gregory Stark is a 79 y.o. male patient of Dr Doy Hutching who was referral for speech evaluation. Review of chart reveals pt has right carotid endarterectomy on 08/09/2019. Referral made on 11/28/2019 d/t pain and numbness with slurred speech.  Current medical history includes HTN, Gastroesophageal reflux disease without esophagitis, Hyperlipidemia, Primary osteoarthritis of left knee, Carotid stenosis, and Abnormal ECG.     Behavioral/Cognition pleasant, socially appropriate    Mobility Status ambulatory      Balance Screen   Has the patient fallen in the past 6 months No    Has the patient had a decrease in activity level because of a fear of falling?  No    Is the patient reluctant to leave their home because of a fear of falling?  No      Prior Functional Status   Cognitive/Linguistic Baseline Within functional limits    Type of Home House     Lives With Spouse    Vocation Retired      Associate Professor   Overall Cognitive Status Within Functional Limits for tasks assessed      Auditory Comprehension   Overall Auditory Comprehension Appears within functional limits for tasks assessed      Visual Recognition/Discrimination  Discrimination Within Function Limits      Reading Comprehension   Reading Status Within funtional limits      Expression   Primary Mode of Expression Verbal      Verbal Expression   Overall Verbal Expression Appears within functional limits for tasks assessed      Written Expression   Dominant Hand Right    Written Expression Not tested      Oral Motor/Sensory Function   Overall Oral Motor/Sensory Function Impaired    Labial ROM Reduced right    Labial Symmetry Abnormal symmetry right    Labial Strength Reduced Right    Labial Sensation Reduced Right    Labial Coordination Reduced    Lingual ROM Reduced right    Lingual Symmetry Abnormal symmetry right    Lingual Strength Reduced Right    Lingual Sensation Reduced Right    Lingual Coordination Reduced    Facial  ROM Reduced right    Facial Symmetry Right droop    Facial Strength Reduced    Facial Sensation Reduced    Facial Coordination Reduced    Velum Within Functional Limits    Mandible Within Functional Limits    Overall Oral Motor/Sensory Function Pt presents with impairments of the mandibular branch of facial nerve V and hypoglossal nerve XII.      Motor Speech   Overall Motor Speech Impaired    Respiration Within functional limits    Phonation Normal    Resonance Within functional limits    Articulation Impaired    Level of Impairment Sentence    Intelligibility Intelligibility reduced    Word 75-100% accurate    Phrase 75-100% accurate    Sentence 75-100% accurate    Conversation 50-74% accurate    Motor Planning Witnin functional limits    Motor Speech Errors Not applicable    Effective Techniques Over-articulate    Phonation [facility]             SLP Education - 01/03/20 0906    Education Details Cranial nerves as they relate to current oral deficits    Person(s) Educated Patient    Methods Explanation;Demonstration;Verbal cues;Handout    Comprehension Verbalized understanding            SLP Short Term Goals - 01/03/20 1607      SLP SHORT TERM GOAL #1   Title Pt will produce /s/ in isolation with correct phonetic placement in 8 out of 10 opportunities.    Baseline unable d/t lateralization of phoneme    Time 10    Period --   sessions   Status New      SLP SHORT TERM GOAL #2   Title Pt will produce /s/ in the initial positions of words in 80% of trials.    Baseline unable to produce d/t lateralization of phoneme    Time 10    Period --   sessions   Status New      SLP SHORT TERM GOAL #3   Title Pt will progress through oral motor strength exercises with Minimal cues.    Baseline new goal - severe lingual deficits    Time 10    Period --   sessions   Status New            SLP Long Term Goals - 01/03/20 3710      SLP LONG TERM GOAL #1   Title Pt will  improve speech intelligibility in all communicative contexts with Minimal assistance.  Baseline ~ 80% intelligible    Time 12    Period Weeks    Status New    Target Date 03/27/20            Plan - 01/03/20 0909    Clinical Impression Statement Pt presents with moderate impairments in cranial nerves V (facial) and XII (hypoglossal). Specifically, his has mild to moderate right sided facial weakness, pain at the TMJ when biting, severe right lingual weakness and asymmetry, inability to lateralize tongue to right buccal cavity, and decreased elevation of tongue backwards (especially on the right). This results of lateralization of phoneme /s and distortion of phoneme /r/. Additionally, pt has difficulty creating intraoral pressure to suck thru straw d/t decreased tongue tip strength. Skilled ST is required to target the above deficits to restore oral motor strength and improved speech intelligibility.    Speech Therapy Frequency 2x / week    Duration --   12 weeks   Treatment/Interventions Functional tasks;SLP instruction and feedback;Patient/family education;Oral motor exercises    Potential to Achieve Goals Good    SLP Home Exercise Plan given    Consulted and Agree with Plan of Care Patient           Patient will benefit from skilled therapeutic intervention in order to improve the following deficits and impairments:   Dysarthria and anarthria    Problem List Patient Active Problem List   Diagnosis Date Noted  . Carotid stenosis, asymptomatic, right 08/09/2019  . Abnormal ECG 07/18/2019  . Carotid stenosis 06/23/2019  . History of prosthetic unicompartmental arthroplasty of left knee 12/20/2018  . Gastroesophageal reflux disease without esophagitis 11/16/2017  . HTN, goal below 140/80 11/16/2017  . Hyperlipidemia 11/16/2017  . Primary osteoarthritis of left knee 11/16/2017   Malaina Mortellaro B. Rutherford Nail M.S., CCC-SLP, [place] Pathologist Rehabilitation Services Office  [phone]   Stormy Fabian 01/03/2020, 9:25 AM  [place] MAIN [facility] SERVICES [address] [place], [place], [address] Phone: [phone]   Fax:  [phone]  Name: Gregory Stark MRN: [id number] Date of Birth: 1941-04-12

## 2020-01-04 ENCOUNTER — Ambulatory Visit: Payer: Medicare Other | Attending: Internal Medicine | Admitting: Speech Pathology

## 2020-01-04 ENCOUNTER — Other Ambulatory Visit: Payer: Self-pay

## 2020-01-04 DIAGNOSIS — R471 Dysarthria and anarthria: Secondary | ICD-10-CM | POA: Insufficient documentation

## 2020-01-05 ENCOUNTER — Encounter: Payer: Self-pay | Admitting: Speech Pathology

## 2020-01-05 NOTE — Therapy (Signed)
[place] MAIN [facility] SERVICES [address] [place], [place], [address] Phone: [phone]   Fax:  [phone]  Speech Language Pathology Treatment  Patient Details  Name: Gregory Stark MRN: [id number] Date of Birth: Apr 05, 1941 Referring Provider (SLP): Fulton Reek   Encounter Date: 01/04/2020   End of Session - 01/05/20 0728    Visit Number 2    Number of Visits 25    Date for SLP Re-Evaluation 03/27/20    Authorization Type Medicare    Authorization Time Period 01/02/2020 thru 03/27/2020    Authorization - Visit Number 2    Progress Note Due on Visit 10    SLP Start Time 1100    SLP Stop Time  1200    SLP Time Calculation (min) 60 min    Activity Tolerance Patient tolerated treatment well           Past Medical History:  Diagnosis Date  . Actinic keratosis   . Basal cell carcinoma 03/23/2019   Right chest. Superficial and nodular patterns. Tx: EDC  . Cancer ([place])    chest  . Gastroesophageal reflux disease   . Hyperlipidemia   . Hypertension   . Peripheral vascular disease [facility])     Past Surgical History:  Procedure Laterality Date  . arthroscoppy knee Left   . CATARACT EXTRACTION, BILATERAL    . ENDARTERECTOMY Right 08/09/2019   Procedure: ENDARTERECTOMY CAROTID;  Surgeon: Katha Cabal, MD;  Location: [facility] ORS;  Service: Vascular;  Laterality: Right;    There were no vitals filed for this visit.   Subjective Assessment - 01/05/20 0726    Subjective pt pleasant, conversant, eager    Currently in Pain? No/denies           ADULT SLP TREATMENT - 01/05/20 0001      General Information   Behavior/Cognition Alert;Cooperative;Pleasant mood    HPI Gregory Stark is a 79 y.o. male patient of Dr Doy Hutching who was referral for speech evaluation. Review of chart reveals pt has right carotid endarterectomy on 08/09/2019. Referral made on 11/28/2019 d/t pain and numbness with slurred speech.  Current medical history  includes HTN, Gastroesophageal reflux disease without esophagitis, Hyperlipidemia, Primary osteoarthritis of left knee, Carotid stenosis, and Abnormal ECG.         Treatment Provided   Treatment provided Cognitive-Linquistic      Pain Assessment   Pain Assessment No/denies pain      Cognitive-Linquistic Treatment   Treatment focused on Dysarthria;Patient/family/caregiver education    Skilled Treatment Education provided on results of evaluation, diagnosis, goals and prognosis. Oral motor exercises for tongue tip elevation and lingual lateralization introduced.        Assessment / Recommendations / Plan   Plan Continue with current plan of care      Progression Toward Goals   Progression toward goals Progressing toward goals            SLP Education - 01/05/20 0728    Education Details evaluation results, goals, prognosis    Person(s) Educated Patient    Methods Explanation;Demonstration;Verbal cues;Handout    Comprehension Verbalized understanding            SLP Short Term Goals - 01/03/20 6160      SLP SHORT TERM GOAL #1   Title Pt will produce /s/ in isolation with correct phonetic placement in 8 out of 10 opportunities.    Baseline unable d/t lateralization of phoneme    Time 10  Period --   sessions   Status New      SLP SHORT TERM GOAL #2   Title Pt will produce /s/ in the initial positions of words in 80% of trials.    Baseline unable to produce d/t lateralization of phoneme    Time 10    Period --   sessions   Status New      SLP SHORT TERM GOAL #3   Title Pt will progress through oral motor strength exercises with Minimal cues.    Baseline new goal - severe lingual deficits    Time 10    Period --   sessions   Status New            SLP Long Term Goals - 01/03/20 1735      SLP LONG TERM GOAL #1   Title Pt will improve speech intelligibility in all communicative contexts with Minimal assistance.    Baseline ~ 80% intelligible    Time 12     Period Weeks    Status New    Target Date 03/27/20            Plan - 01/05/20 0728    Clinical Impression Statement Skilled treatment session targeted pt's oral motor goals. SLP facilitated session by providing multimodal cues (mirror, verbal, visual, tactile) to promote tongue tip elevation in isolation from jaw and labial movements. Pt unable to lateralize tongue to right buccal cavity with tactile and verbal cues. Peanut butter placed on a tongue depressor placed at right corner of mouth helpful with lateralization. Education provided on evaluation results with all questions answered to pt's satisfaction.    Speech Therapy Frequency 2x / week    Duration --   12 weeks   Treatment/Interventions Functional tasks;SLP instruction and feedback;Patient/family education;Oral motor exercises    Potential to Achieve Goals Good    SLP Home Exercise Plan given - tongue tip elevation, lingual lateralization exercises    Consulted and Agree with Plan of Care Patient           Patient will benefit from skilled therapeutic intervention in order to improve the following deficits and impairments:   Dysarthria and anarthria    Problem List Patient Active Problem List   Diagnosis Date Noted  . Carotid stenosis, asymptomatic, right 08/09/2019  . Abnormal ECG 07/18/2019  . Carotid stenosis 06/23/2019  . History of prosthetic unicompartmental arthroplasty of left knee 12/20/2018  . Gastroesophageal reflux disease without esophagitis 11/16/2017  . HTN, goal below 140/80 11/16/2017  . Hyperlipidemia 11/16/2017  . Primary osteoarthritis of left knee 11/16/2017   Blaike Vickers B. Rutherford Nail M.S., CCC-SLP, [place] Pathologist Rehabilitation Services Office [phone]  Stormy Fabian 01/05/2020, 7:30 AM  [place] MAIN [facility] SERVICES [address] [place], [place], [address] Phone: [phone]   Fax:  [phone]   Name: Gregory Stark MRN:  [id number] Date of Birth: 1940-11-06

## 2020-01-09 ENCOUNTER — Other Ambulatory Visit: Payer: Self-pay

## 2020-01-09 ENCOUNTER — Ambulatory Visit: Payer: Medicare Other | Admitting: Speech Pathology

## 2020-01-09 DIAGNOSIS — R471 Dysarthria and anarthria: Secondary | ICD-10-CM | POA: Diagnosis not present

## 2020-01-10 NOTE — Therapy (Signed)
[place] MAIN [facility] SERVICES [address] [place], [place], [address] Phone: [phone]   Fax:  [phone]  Speech Language Pathology Treatment  Patient Details  Name: Gregory Stark MRN: [id number] Date of Birth: 1940-09-30 Referring Provider (SLP): Fulton Reek   Encounter Date: 01/09/2020   End of Session - 01/10/20 1725    Visit Number 3    Number of Visits 25    Date for SLP Re-Evaluation 03/27/20    Authorization Type Medicare    Authorization Time Period 01/02/2020 thru 03/27/2020    Authorization - Visit Number 3    Progress Note Due on Visit 10    SLP Start Time 1500    SLP Stop Time  1600    SLP Time Calculation (min) 60 min    Activity Tolerance Patient tolerated treatment well           Past Medical History:  Diagnosis Date  . Actinic keratosis   . Basal cell carcinoma 03/23/2019   Right chest. Superficial and nodular patterns. Tx: EDC  . Cancer ([place])    chest  . Gastroesophageal reflux disease   . Hyperlipidemia   . Hypertension   . Peripheral vascular disease [facility])     Past Surgical History:  Procedure Laterality Date  . arthroscoppy knee Left   . CATARACT EXTRACTION, BILATERAL    . ENDARTERECTOMY Right 08/09/2019   Procedure: ENDARTERECTOMY CAROTID;  Surgeon: Katha Cabal, MD;  Location: [facility] ORS;  Service: Vascular;  Laterality: Right;    There were no vitals filed for this visit.   Subjective Assessment - 01/10/20 1723    Subjective pt pleasant, conversant, eager    Currently in Pain? No/denies                 ADULT SLP TREATMENT - 01/10/20 0001      General Information   Behavior/Cognition Alert;Cooperative;Pleasant mood    HPI Gregory Stark is a 79 y.o. male patient of Dr Doy Hutching who was referral for speech evaluation. Review of chart reveals pt has right carotid endarterectomy on 08/09/2019. Referral made on 11/28/2019 d/t pain and numbness with slurred speech.  Current medical  history includes HTN, Gastroesophageal reflux disease without esophagitis, Hyperlipidemia, Primary osteoarthritis of left knee, Carotid stenosis, and Abnormal ECG.         Treatment Provided   Treatment provided Cognitive-Linquistic      Pain Assessment   Pain Assessment No/denies pain      Cognitive-Linquistic Treatment   Treatment focused on Dysarthria;Patient/family/caregiver education    Skilled Treatment Education provided on previous sessions note and instruction provided on how to locate information contained in treatment notes. Tongue lateralization targeted with maximal visual, verbal and tactile cues. Front air movement introduced for /s/.        Assessment / Recommendations / Plan   Plan Continue with current plan of care      Progression Toward Goals   Progression toward goals Progressing toward goals            SLP Education - 01/10/20 1724    Education Details treatment notes, rationale for activities targeting increased tongue lateralization    Person(s) Educated Patient    Methods Explanation;Demonstration;Verbal cues;Handout    Comprehension Verbalized understanding            SLP Short Term Goals - 01/03/20 2353      SLP SHORT TERM GOAL #1   Title Pt will produce /s/ in isolation with correct phonetic  placement in 8 out of 10 opportunities.    Baseline unable d/t lateralization of phoneme    Time 10    Period --   sessions   Status New      SLP SHORT TERM GOAL #2   Title Pt will produce /s/ in the initial positions of words in 80% of trials.    Baseline unable to produce d/t lateralization of phoneme    Time 10    Period --   sessions   Status New      SLP SHORT TERM GOAL #3   Title Pt will progress through oral motor strength exercises with Minimal cues.    Baseline new goal - severe lingual deficits    Time 10    Period --   sessions   Status New            SLP Long Term Goals - 01/03/20 5697      SLP LONG TERM GOAL #1   Title Pt will  improve speech intelligibility in all communicative contexts with Minimal assistance.    Baseline ~ 80% intelligible    Time 12    Period Weeks    Status New    Target Date 03/27/20            Plan - 01/10/20 1725    Clinical Impression Statement Skilled treatment session targeted pt's ongoing speech intelligibility goals. During conversation, pt's speech is completing intelligible with intermittent distortions with words when spoken in isolation such as "[place]." When sticking out his tongue, pt's tongue is now more midline and pt can place tongue tip to right corner of his mouth. SLP further facilitated session by providing tasks that targeted tongue lateralization without an increase in mandibular movements.    Speech Therapy Frequency 2x / week    Duration --   12 weeks   Treatment/Interventions Functional tasks;SLP instruction and feedback;Patient/family education;Oral motor exercises    Potential to Achieve Goals Good    SLP Home Exercise Plan given - tongue tip elevation, lingual lateralization exercises    Consulted and Agree with Plan of Care Patient           Patient will benefit from skilled therapeutic intervention in order to improve the following deficits and impairments:   Dysarthria and anarthria    Problem List Patient Active Problem List   Diagnosis Date Noted  . Carotid stenosis, asymptomatic, right 08/09/2019  . Abnormal ECG 07/18/2019  . Carotid stenosis 06/23/2019  . History of prosthetic unicompartmental arthroplasty of left knee 12/20/2018  . Gastroesophageal reflux disease without esophagitis 11/16/2017  . HTN, goal below 140/80 11/16/2017  . Hyperlipidemia 11/16/2017  . Primary osteoarthritis of left knee 11/16/2017   Brandun Pinn B. Rutherford Nail M.S., CCC-SLP, [place] Pathologist Rehabilitation Services Office [phone]  Stormy Fabian 01/10/2020, 5:26 PM  [place] MAIN [facility] SERVICES [address]  [place], [place], [address] Phone: [phone]   Fax:  [phone]   Name: JARONE Stark MRN: [id number] Date of Birth: 07-16-1940

## 2020-01-11 ENCOUNTER — Other Ambulatory Visit: Payer: Self-pay

## 2020-01-11 ENCOUNTER — Ambulatory Visit: Payer: Medicare Other | Admitting: Speech Pathology

## 2020-01-11 DIAGNOSIS — R471 Dysarthria and anarthria: Secondary | ICD-10-CM | POA: Diagnosis not present

## 2020-01-12 NOTE — Therapy (Signed)
[place] MAIN [facility] SERVICES [address] [place], [place], [address] Phone: [phone]   Fax:  [phone]  Speech Language Pathology Treatment  Patient Details  Name: Gregory Stark MRN: [id number] Date of Birth: [birth year] Referring Provider (SLP): Fulton Reek   Encounter Date: 01/11/2020   End of Session - 01/12/20 1535    Visit Number 4    Number of Visits 25    Date for SLP Re-Evaluation 03/27/20    Authorization Type Medicare    Authorization Time Period 01/02/2020 thru 03/27/2020    Authorization - Visit Number 4    Progress Note Due on Visit 10    SLP Start Time 1500    SLP Stop Time  1600    SLP Time Calculation (min) 60 min    Activity Tolerance Patient tolerated treatment well           Past Medical History:  Diagnosis Date  . Actinic keratosis   . Basal cell carcinoma 03/23/2019   Right chest. Superficial and nodular patterns. Tx: EDC  . Cancer ([place])    chest  . Gastroesophageal reflux disease   . Hyperlipidemia   . Hypertension   . Peripheral vascular disease [facility])     Past Surgical History:  Procedure Laterality Date  . arthroscoppy knee Left   . CATARACT EXTRACTION, BILATERAL    . ENDARTERECTOMY Right 08/09/2019   Procedure: ENDARTERECTOMY CAROTID;  Surgeon: Katha Cabal, MD;  Location: [facility] ORS;  Service: Vascular;  Laterality: Right;    There were no vitals filed for this visit.   Subjective Assessment - 01/12/20 1533    Subjective pt pleasant, conversant, eager    Currently in Pain? No/denies                 ADULT SLP TREATMENT - 01/12/20 0001      General Information   Behavior/Cognition Alert;Cooperative;Pleasant mood    HPI Gregory Stark is a 79 y.o. male patient of Dr Doy Hutching who was referral for speech evaluation. Review of chart reveals pt has right carotid endarterectomy on 08/09/2019. Referral made on 11/28/2019 d/t pain and numbness with slurred speech.  Current medical  history includes HTN, Gastroesophageal reflux disease without esophagitis, Hyperlipidemia, Primary osteoarthritis of left knee, Carotid stenosis, and Abnormal ECG.         Treatment Provided   Treatment provided Cognitive-Linquistic      Pain Assessment   Pain Assessment No/denies pain      Cognitive-Linquistic Treatment   Treatment focused on Dysarthria;Patient/family/caregiver education    Skilled Treatment Moderate multimodal cues used to facilitate lingual lateralization from right left; /t/ used to facilitate frontal production of /s/ in isolation      Assessment / Recommendations / Plan   Plan Continue with current plan of care      Progression Toward Goals   Progression toward goals Progressing toward goals            SLP Education - 01/12/20 1534    Education Details articulatory placement of /s/    Person(s) Educated Patient    Methods Explanation;Demonstration;Verbal cues;Tactile cues    Comprehension Verbalized understanding            SLP Short Term Goals - 01/03/20 0349      SLP SHORT TERM GOAL #1   Title Pt will produce /s/ in isolation with correct phonetic placement in 8 out of 10 opportunities.    Baseline unable d/t lateralization of phoneme  Time 10    Period --   sessions   Status New      SLP SHORT TERM GOAL #2   Title Pt will produce /s/ in the initial positions of words in 80% of trials.    Baseline unable to produce d/t lateralization of phoneme    Time 10    Period --   sessions   Status New      SLP SHORT TERM GOAL #3   Title Pt will progress through oral motor strength exercises with Minimal cues.    Baseline new goal - severe lingual deficits    Time 10    Period --   sessions   Status New            SLP Long Term Goals - 01/03/20 3532      SLP LONG TERM GOAL #1   Title Pt will improve speech intelligibility in all communicative contexts with Minimal assistance.    Baseline ~ 80% intelligible    Time 12    Period Weeks     Status New    Target Date 03/27/20            Plan - 01/12/20 1535    Clinical Impression Statement Skilled treatment session targeted pt's oral motor ability and correct produciton of /s/. SLP facilitated session by providing visual model, mirror, tactile sensory cues (use of lifesaver in lateral sulcus) to aid pt is lingual lateralization to increase oral motor ability. SLP also facilitated session by providing maximal cues for correct frontal production of /s/ by utilizing /t/. Pt with increased ability to produce /s/ in isolation using /t/ in isolation first.    Speech Therapy Frequency 2x / week    Duration --   12 weeks   Treatment/Interventions Functional tasks;SLP instruction and feedback;Patient/family education;Oral motor exercises    Potential to Achieve Goals Good    SLP Home Exercise Plan given - tongue tip elevation, lingual lateralization exercises    Consulted and Agree with Plan of Care Patient           Patient will benefit from skilled therapeutic intervention in order to improve the following deficits and impairments:   Dysarthria and anarthria    Problem List Patient Active Problem List   Diagnosis Date Noted  . Carotid stenosis, asymptomatic, right 08/09/2019  . Abnormal ECG 07/18/2019  . Carotid stenosis 06/23/2019  . History of prosthetic unicompartmental arthroplasty of left knee 12/20/2018  . Gastroesophageal reflux disease without esophagitis 11/16/2017  . HTN, goal below 140/80 11/16/2017  . Hyperlipidemia 11/16/2017  . Primary osteoarthritis of left knee 11/16/2017   Myldred Raju B. Rutherford Nail M.S., CCC-SLP, [place] Pathologist Rehabilitation Services Office [phone]  Stormy Fabian 01/12/2020, 3:36 PM  [place] MAIN [facility] SERVICES [address] [place], [place], [address] Phone: [phone]   Fax:  [phone]   Name: Gregory Stark MRN: [id number] Date of Birth: Nov 13, 1940

## 2020-01-16 ENCOUNTER — Other Ambulatory Visit: Payer: Self-pay

## 2020-01-16 ENCOUNTER — Ambulatory Visit: Payer: Medicare Other | Admitting: Speech Pathology

## 2020-01-16 DIAGNOSIS — R471 Dysarthria and anarthria: Secondary | ICD-10-CM | POA: Diagnosis not present

## 2020-01-17 NOTE — Therapy (Signed)
[place] MAIN [facility] SERVICES [address] [place], [place], [address] Phone: [phone]   Fax:  [phone]  Speech Language Pathology Treatment  Patient Details  Name: Gregory Stark MRN: [id number] Date of Birth: 09-20-1940 Referring Provider (SLP): Gregory Stark   Encounter Date: 01/16/2020   End of Session - 01/17/20 1313    Visit Number 5    Number of Visits 25    Date for SLP Re-Evaluation 03/27/20    Authorization Type Medicare    Authorization Time Period 01/02/2020 thru 03/27/2020    Authorization - Visit Number 5    Progress Note Due on Visit 10    SLP Start Time 1430    SLP Stop Time  1530    SLP Time Calculation (min) 60 min    Activity Tolerance Patient tolerated treatment well           Past Medical History:  Diagnosis Date  . Actinic keratosis   . Basal cell carcinoma 03/23/2019   Right chest. Superficial and nodular patterns. Tx: EDC  . Cancer ([place])    chest  . Gastroesophageal reflux disease   . Hyperlipidemia   . Hypertension   . Peripheral vascular disease [facility])     Past Surgical History:  Procedure Laterality Date  . arthroscoppy knee Left   . CATARACT EXTRACTION, BILATERAL    . ENDARTERECTOMY Right 08/09/2019   Procedure: ENDARTERECTOMY CAROTID;  Surgeon: Gregory Cabal, MD;  Location: [facility] ORS;  Service: Vascular;  Laterality: Right;    There were no vitals filed for this visit.   Subjective Assessment - 01/17/20 1312    Subjective pt pleasant, conversant, eager    Currently in Pain? No/denies                 ADULT SLP TREATMENT - 01/17/20 0001      General Information   Behavior/Cognition Alert;Cooperative;Pleasant mood    HPI Gregory Stark is a 79 y.o. male patient of Dr Gregory Stark who was referral for speech evaluation. Review of chart reveals pt has right carotid endarterectomy on 08/09/2019. Referral made on 11/28/2019 d/t pain and numbness with slurred speech.  Current medical  history includes HTN, Gastroesophageal reflux disease without esophagitis, Hyperlipidemia, Primary osteoarthritis of left knee, Carotid stenosis, and Abnormal ECG.         Treatment Provided   Treatment provided Cognitive-Linquistic      Pain Assessment   Pain Assessment No/denies pain      Cognitive-Linquistic Treatment   Treatment focused on Dysarthria;Patient/family/caregiver education    Skilled Treatment Pt was moderately independent with increased precision of articulation during spontaneous connected speech during session, demonstrating great carryover of anterior lingual position when producing /s/; With Maximal to Total multimodal cues pt was not able to isolate lingual movements from mandibular movements       Assessment / Recommendations / Plan   Plan Continue with current plan of care      Progression Toward Goals   Progression toward goals Progressing toward goals            SLP Education - 01/17/20 1313    Education Details oral motor abilities, articulatory placement of /s/    Person(s) Educated Patient    Methods Explanation;Demonstration;Verbal cues;Tactile cues;Handout    Comprehension Verbalized understanding            SLP Short Term Goals - 01/03/20 6962      SLP SHORT TERM GOAL #1   Title Pt will produce /  s/ in isolation with correct phonetic placement in 8 out of 10 opportunities.    Baseline unable d/t lateralization of phoneme    Time 10    Period --   sessions   Status New      SLP SHORT TERM GOAL #2   Title Pt will produce /s/ in the initial positions of words in 80% of trials.    Baseline unable to produce d/t lateralization of phoneme    Time 10    Period --   sessions   Status New      SLP SHORT TERM GOAL #3   Title Pt will progress through oral motor strength exercises with Minimal cues.    Baseline new goal - severe lingual deficits    Time 10    Period --   sessions   Status New            SLP Long Term Goals - 01/03/20 3614       SLP LONG TERM GOAL #1   Title Pt will improve speech intelligibility in all communicative contexts with Minimal assistance.    Baseline ~ 80% intelligible    Time 12    Period Weeks    Status New    Target Date 03/27/20            Plan - 01/17/20 1315    Clinical Impression Statement Skilled treatment session targeted pt's dysarthria goals. SLP facilitated session by promoting opportunities for spontaneous connected speech. Pt was moderately independent with improved articulatory precision when spontaneously speaking. Despite total level of multimodal cueing, he is not able to complete lingual lateralization exercises without overextending his jaw. He is unable to isolate lingual movements from mandibular movements. 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 Pt continues to state that friends and family don't notice any difference in his speech. Question possible pre-existing speech pattern that pt is now more aware of d/t numbness on right post surgery. Plan created for wife to attend next session for more baseline information.    Speech Therapy Frequency 2x / week    Duration --   12 weeks   Treatment/Interventions Functional tasks;SLP instruction and feedback;Patient/family education;Oral motor exercises    Potential to Achieve Goals Good    SLP Home Exercise Plan given    Consulted and Agree with Plan of Care Patient           Patient will benefit from skilled therapeutic intervention in order to improve the following deficits and impairments:   Dysarthria and anarthria    Problem List Patient Active Problem List   Diagnosis Date Noted  . Carotid stenosis, asymptomatic, right 08/09/2019  . Abnormal ECG 07/18/2019  . Carotid stenosis 06/23/2019  . History of prosthetic unicompartmental arthroplasty of left knee 12/20/2018  . Gastroesophageal reflux disease without esophagitis 11/16/2017  . HTN, goal below  140/80 11/16/2017  . Hyperlipidemia 11/16/2017  . Primary osteoarthritis of left knee 11/16/2017   [name] B. Rutherford Nail M.S., CCC-SLP, [place] Pathologist Rehabilitation Services Office [phone]  Stormy Fabian 01/17/2020, 1:16 PM  [place] MAIN [facility] SERVICES [address] [place], [place], [address] Phone: [phone]   Fax:  [phone]   Name: Gregory Stark MRN: [id number] Date of Birth: 1941/05/02

## 2020-01-18 ENCOUNTER — Other Ambulatory Visit: Payer: Self-pay

## 2020-01-18 ENCOUNTER — Ambulatory Visit: Payer: Medicare Other | Admitting: Speech Pathology

## 2020-01-18 DIAGNOSIS — R471 Dysarthria and anarthria: Secondary | ICD-10-CM

## 2020-01-18 NOTE — Therapy (Signed)
[place] MAIN [facility] SERVICES [address] [place], [place], [address] Phone: [phone]   Fax:  [phone]  Speech Language Pathology Treatment  Patient Details  Name: Gregory Stark MRN: [id number] Date of Birth: [birth year] Referring Provider (SLP): Fulton Reek   Encounter Date: 01/18/2020   End of Session - 01/18/20 1544    Visit Number 6    Number of Visits 25    Date for SLP Re-Evaluation 03/27/20    Authorization Type Medicare    Authorization Time Period 01/02/2020 thru 03/27/2020    Authorization - Visit Number 6    Progress Note Due on Visit 10    SLP Start Time 1300    SLP Stop Time  1400    SLP Time Calculation (min) 60 min    Activity Tolerance Patient tolerated treatment well           Past Medical History:  Diagnosis Date  . Actinic keratosis   . Basal cell carcinoma 03/23/2019   Right chest. Superficial and nodular patterns. Tx: EDC  . Cancer ([place])    chest  . Gastroesophageal reflux disease   . Hyperlipidemia   . Hypertension   . Peripheral vascular disease [facility])     Past Surgical History:  Procedure Laterality Date  . arthroscoppy knee Left   . CATARACT EXTRACTION, BILATERAL    . ENDARTERECTOMY Right 08/09/2019   Procedure: ENDARTERECTOMY CAROTID;  Surgeon: Katha Cabal, MD;  Location: [facility] ORS;  Service: Vascular;  Laterality: Right;    There were no vitals filed for this visit.   Subjective Assessment - 01/18/20 1543    Subjective pt pleasant, conversant, eager    Patient is accompained by: Family member    Currently in Pain? No/denies                 ADULT SLP TREATMENT - 01/18/20 0001      General Information   Behavior/Cognition Alert;Cooperative;Pleasant mood    HPI Gregory Stark is a 79 y.o. male patient of Dr Doy Hutching who was referral for speech evaluation. Review of chart reveals pt has right carotid endarterectomy on 08/09/2019. Referral made on 11/28/2019 d/t pain and  numbness with slurred speech.  Current medical history includes HTN, Gastroesophageal reflux disease without esophagitis, Hyperlipidemia, Primary osteoarthritis of left knee, Carotid stenosis, and Abnormal ECG.         Treatment Provided   Treatment provided Cognitive-Linquistic      Pain Assessment   Pain Assessment No/denies pain      Cognitive-Linquistic Treatment   Treatment focused on Dysarthria;Patient/family/caregiver education    Skilled Treatment Education provided on St POC with pt's wife, she also reports that pt's distorted speech is NOT baseline but rather is acute post-surgery: Minimal assistance required for lingual lateralization while utilizing a lifesaver: moderate multimodal cues for correct articulatory placement and production of /s/ in isolation       Assessment / Recommendations / Plan   Plan Continue with current plan of care      Progression Toward Goals   Progression toward goals Progressing toward goals            SLP Education - 01/18/20 1544    Education Details oral motor abilities, articulatory placement of /s/    Person(s) Educated Patient;Spouse    Methods Explanation;Demonstration;Tactile cues;Verbal cues;Handout    Comprehension Verbalized understanding;Returned demonstration            SLP Short Term Goals - 01/03/20 6546  SLP SHORT TERM GOAL #1   Title Pt will produce /s/ in isolation with correct phonetic placement in 8 out of 10 opportunities.    Baseline unable d/t lateralization of phoneme    Time 10    Period --   sessions   Status New      SLP SHORT TERM GOAL #2   Title Pt will produce /s/ in the initial positions of words in 80% of trials.    Baseline unable to produce d/t lateralization of phoneme    Time 10    Period --   sessions   Status New      SLP SHORT TERM GOAL #3   Title Pt will progress through oral motor strength exercises with Minimal cues.    Baseline new goal - severe lingual deficits    Time 10     Period --   sessions   Status New            SLP Long Term Goals - 01/03/20 8841      SLP LONG TERM GOAL #1   Title Pt will improve speech intelligibility in all communicative contexts with Minimal assistance.    Baseline ~ 80% intelligible    Time 12    Period Weeks    Status New    Target Date 03/27/20            Plan - 01/18/20 1545    Clinical Impression Statement Skilled treatment session targeted pt's dysarthria goals. Pt's wife was present for this session to provide some information regarding pt's baseline speech intelligibility. She states that pt's speech is acutely distorted and not at baseline. She also states that his daughter and daughter-in-law also have mentioned to pt that his speech is distorted. As such, SLP further facilitated session by providing moderate multimodal cues for correct production of /s/ in isolation. Pt with ~ 50% accuracy.    Speech Therapy Frequency 2x / week    Duration --   12 weeks   Treatment/Interventions Functional tasks;SLP instruction and feedback;Patient/family education;Oral motor exercises    Potential to Achieve Goals Good    SLP Home Exercise Plan given    Consulted and Agree with Plan of Care Patient;Family member/caregiver    Family Member Consulted wife           Patient will benefit from skilled therapeutic intervention in order to improve the following deficits and impairments:   Dysarthria and anarthria    Problem List Patient Active Problem List   Diagnosis Date Noted  . Carotid stenosis, asymptomatic, right 08/09/2019  . Abnormal ECG 07/18/2019  . Carotid stenosis 06/23/2019  . History of prosthetic unicompartmental arthroplasty of left knee 12/20/2018  . Gastroesophageal reflux disease without esophagitis 11/16/2017  . HTN, goal below 140/80 11/16/2017  . Hyperlipidemia 11/16/2017  . Primary osteoarthritis of left knee 11/16/2017   Caroline Matters B. Rutherford Nail M.S., CCC-SLP, [place]  Pathologist Rehabilitation Services Office [phone]  Stormy Fabian 01/18/2020, 3:45 PM  [place] MAIN [facility] SERVICES [address] [place], [place], [address] Phone: [phone]   Fax:  [phone]   Name: Gregory Stark MRN: [id number] Date of Birth: 1941-06-28

## 2020-01-23 ENCOUNTER — Ambulatory Visit: Payer: Medicare Other | Admitting: Speech Pathology

## 2020-01-23 ENCOUNTER — Other Ambulatory Visit: Payer: Self-pay

## 2020-01-23 DIAGNOSIS — R471 Dysarthria and anarthria: Secondary | ICD-10-CM

## 2020-01-24 NOTE — Therapy (Signed)
[place] MAIN [facility] SERVICES [address] [place], [place], [address] Phone: [phone]   Fax:  [phone]  Speech Language Pathology Treatment DISCHARGE SUMMARY  Patient Details  Name: Gregory Stark MRN: [id number] Date of Birth: [birth year] Referring Provider (SLP): Fulton Reek   Encounter Date: 01/23/2020   End of Session - 01/24/20 0955    Visit Number 7    Authorization Type Medicare    Authorization - Visit Number 7    SLP Start Time 1100    SLP Stop Time  1130    SLP Time Calculation (min) 30 min    Activity Tolerance Patient tolerated treatment well           Past Medical History:  Diagnosis Date  . Actinic keratosis   . Basal cell carcinoma 03/23/2019   Right chest. Superficial and nodular patterns. Tx: EDC  . Cancer ([place])    chest  . Gastroesophageal reflux disease   . Hyperlipidemia   . Hypertension   . Peripheral vascular disease [facility])     Past Surgical History:  Procedure Laterality Date  . arthroscoppy knee Left   . CATARACT EXTRACTION, BILATERAL    . ENDARTERECTOMY Right 08/09/2019   Procedure: ENDARTERECTOMY CAROTID;  Surgeon: Katha Cabal, MD;  Location: [facility] ORS;  Service: Vascular;  Laterality: Right;    There were no vitals filed for this visit.   Subjective Assessment - 01/24/20 0952    Subjective pt pleasant, conversant, eager    Currently in Pain? No/denies             ADULT SLP TREATMENT - 01/24/20 0001      General Information   Behavior/Cognition Alert;Cooperative;Pleasant mood    HPI Gregory Stark is a 79 y.o. male patient of Dr Doy Hutching who was referral for speech evaluation. Review of chart reveals pt has right carotid endarterectomy on 08/09/2019. Referral made on 11/28/2019 d/t pain and numbness with slurred speech.  Current medical history includes HTN, Gastroesophageal reflux disease without esophagitis, Hyperlipidemia, Primary osteoarthritis of left knee, Carotid  stenosis, and Abnormal ECG.         Treatment Provided   Treatment provided Cognitive-Linquistic      Pain Assessment   Pain Assessment No/denies pain      Cognitive-Linquistic Treatment   Treatment focused on Dysarthria;Patient/family/caregiver education    Skilled Treatment Intermittent supervision cues to increase articulatory placement for medial and final /s/ in complex conversational speech.       Assessment / Recommendations / Plan   Plan Continue with current plan of care      Progression Toward Goals   Progression toward goals Progressing toward goals            SLP Education - 01/24/20 0954    Education Details pt will need a new referal to resume ST services    Person(s) Educated Patient    Methods Explanation    Comprehension Verbalized understanding;Returned demonstration            SLP Short Term Goals - 01/24/20 0958      SLP SHORT TERM GOAL #1   Title Pt will produce /s/ in isolation with correct phonetic placement in 8 out of 10 opportunities.    Baseline unable d/t lateralization of phoneme    Status Deferred      SLP SHORT TERM GOAL #2   Title Pt will produce /s/ in the initial positions of words in 80% of trials.    Baseline unable  to produce d/t lateralization of phoneme    Status Deferred      SLP SHORT TERM GOAL #3   Title Pt will progress through oral motor strength exercises with Minimal cues.    Baseline new goal - severe lingual deficits    Status Deferred            SLP Long Term Goals - 01/24/20 0959      SLP LONG TERM GOAL #1   Title Pt will improve speech intelligibility in all communicative contexts with Minimal assistance.    Baseline ~ 80% intelligible    Status Achieved            Plan - 01/24/20 0956    Clinical Impression Statement Pt states that he would like to pursue an appt with Dr Delana Meyer and receive a prognosis regarding any possible return in facial, lingual and mandible strength and sensation before  proceeding with any further ST sessions. Per pt request, discharge has been completed and he is aware that he will need a new referral should he consider further ST services.    Consulted and Agree with Plan of Care Patient           Patient will benefit from skilled therapeutic intervention in order to improve the following deficits and impairments:   Dysarthria and anarthria    Problem List Patient Active Problem List   Diagnosis Date Noted  . Carotid stenosis, asymptomatic, right 08/09/2019  . Abnormal ECG 07/18/2019  . Carotid stenosis 06/23/2019  . History of prosthetic unicompartmental arthroplasty of left knee 12/20/2018  . Gastroesophageal reflux disease without esophagitis 11/16/2017  . HTN, goal below 140/80 11/16/2017  . Hyperlipidemia 11/16/2017  . Primary osteoarthritis of left knee 11/16/2017   Gregory Stark B. Rutherford Nail M.S., CCC-SLP, [place] Office [phone]  Stormy Fabian 01/24/2020, 10:00 AM  [place] MAIN [facility] SERVICES [address] [place], [place], [address] Phone: [phone]   Fax:  [phone]   Name: Gregory Stark MRN: [id number] Date of Birth: 14-Feb-1941

## 2020-01-25 ENCOUNTER — Ambulatory Visit: Payer: Medicare Other | Admitting: Speech Pathology

## 2020-01-30 ENCOUNTER — Ambulatory Visit: Payer: Medicare Other | Admitting: Speech Pathology

## 2020-02-01 ENCOUNTER — Ambulatory Visit: Payer: Medicare Other | Admitting: Speech Pathology

## 2020-02-06 ENCOUNTER — Ambulatory Visit: Payer: Medicare Other | Admitting: Speech Pathology

## 2020-02-08 ENCOUNTER — Ambulatory Visit: Payer: Medicare Other | Admitting: Speech Pathology

## 2020-02-13 ENCOUNTER — Ambulatory Visit: Payer: Medicare Other | Admitting: Speech Pathology

## 2020-02-15 ENCOUNTER — Ambulatory Visit: Payer: Medicare Other | Admitting: Speech Pathology

## 2020-02-20 ENCOUNTER — Ambulatory Visit: Payer: Medicare Other | Admitting: Speech Pathology

## 2020-02-22 ENCOUNTER — Ambulatory Visit: Payer: Medicare Other | Admitting: Speech Pathology

## 2020-02-27 ENCOUNTER — Ambulatory Visit: Payer: Medicare Other | Admitting: Speech Pathology

## 2020-02-29 ENCOUNTER — Ambulatory Visit: Payer: Medicare Other | Admitting: Speech Pathology

## 2020-03-25 ENCOUNTER — Other Ambulatory Visit: Payer: Self-pay

## 2020-03-25 ENCOUNTER — Ambulatory Visit (INDEPENDENT_AMBULATORY_CARE_PROVIDER_SITE_OTHER): Payer: Medicare Other | Admitting: Vascular Surgery

## 2020-03-25 ENCOUNTER — Encounter (INDEPENDENT_AMBULATORY_CARE_PROVIDER_SITE_OTHER): Payer: Self-pay | Admitting: Vascular Surgery

## 2020-03-25 ENCOUNTER — Ambulatory Visit (INDEPENDENT_AMBULATORY_CARE_PROVIDER_SITE_OTHER): Payer: Medicare Other

## 2020-03-25 VITALS — BP 118/69 | HR 50 | Resp 16 | Wt 200.0 lb

## 2020-03-25 DIAGNOSIS — S04899S Injury of other cranial nerves, unspecified side, sequela: Secondary | ICD-10-CM | POA: Diagnosis not present

## 2020-03-25 DIAGNOSIS — K219 Gastro-esophageal reflux disease without esophagitis: Secondary | ICD-10-CM

## 2020-03-25 DIAGNOSIS — E782 Mixed hyperlipidemia: Secondary | ICD-10-CM | POA: Diagnosis not present

## 2020-03-25 DIAGNOSIS — I6523 Occlusion and stenosis of bilateral carotid arteries: Secondary | ICD-10-CM

## 2020-03-25 DIAGNOSIS — M1712 Unilateral primary osteoarthritis, left knee: Secondary | ICD-10-CM

## 2020-03-27 ENCOUNTER — Encounter (INDEPENDENT_AMBULATORY_CARE_PROVIDER_SITE_OTHER): Payer: Self-pay | Admitting: Vascular Surgery

## 2020-03-27 DIAGNOSIS — S04899S Injury of other cranial nerves, unspecified side, sequela: Secondary | ICD-10-CM | POA: Insufficient documentation

## 2020-03-27 NOTE — Progress Notes (Signed)
MRN : [id number]  Gregory Stark is a 79 y.o. (May 04, 1941) male who presents with chief complaint of  Chief Complaint  Patient presents with  . Follow-up    ultrasound follow up  .  History of Present Illness:   The patient is seen for follow up evaluation of carotid stenosis status post right carotid endarterectomy on 08/09/2019.  There were no post operative problems or complications related to the surgery.  The patient describes a sharp but brief pain over the right mastoid, behind the ear when he begins eating.  It resolves with chewing.  He also notes his tongue is deviating to the right and has had speech therapy to help with this.  He states he is having excessive salivation when he see food from the right side as well.   The patient denies interval amaurosis fugax. There is no recent history of TIA symptoms or focal motor deficits. There is no prior documented CVA.  The patient denies headache.  The patient is taking enteric-coated aspirin 81 mg daily.  The patient has a history of coronary artery disease, no recent episodes of angina or shortness of breath. The patient denies PAD or claudication symptoms. There is a history of hyperlipidemia which is being treated with a statin.   Carotid duplex shows widely patent right ICA and <18% LICA stenosis   Current Meds  Medication Sig  . lisinopril (ZESTRIL) 10 MG tablet Take 10 mg by mouth at bedtime.   . pravastatin (PRAVACHOL) 40 MG tablet Take 40 mg by mouth at bedtime.   . Vitamin D, Ergocalciferol, (DRISDOL) 1.25 MG (50000 UT) CAPS capsule Take 50,000 Units by mouth every 7 (seven) days.    Past Medical History:  Diagnosis Date  . Actinic keratosis   . Basal cell carcinoma 03/23/2019   Right chest. Superficial and nodular patterns. Tx: EDC  . Cancer ([place])    chest  . Gastroesophageal reflux disease   . Hyperlipidemia   . Hypertension   . Peripheral vascular disease [facility])     Past Surgical History:   Procedure Laterality Date  . arthroscoppy knee Left   . CATARACT EXTRACTION, BILATERAL    . ENDARTERECTOMY Right 08/09/2019   Procedure: ENDARTERECTOMY CAROTID;  Surgeon: Katha Cabal, MD;  Location: [facility] ORS;  Service: Vascular;  Laterality: Right;    Social History Social History   Tobacco Use  . Smoking status: Never Smoker  . Smokeless tobacco: Never Used  Vaping Use  . Vaping Use: Never used  Substance Use Topics  . Alcohol use: Yes    Alcohol/week: 4.0 standard drinks    Types: 3 Shots of liquor, 1 Glasses of wine per week    Comment: socially  . Drug use: Never    Family History Family History  Problem Relation Age of Onset  . Breast cancer Mother   . Macular degeneration Mother   . Colon cancer Father   . Melanoma Sister   . Lupus Sister     No Known Allergies   REVIEW OF SYSTEMS (Negative unless checked)  Constitutional: [] Weight loss  [] Fever  [] Chills Cardiac: [] Chest pain   [] Chest pressure   [] Palpitations   [] Shortness of breath when laying flat   [] Shortness of breath with exertion. Vascular:  [] Pain in legs with walking   [] Pain in legs at rest  [] History of DVT   [] Phlebitis   [] Swelling in legs   [] Varicose veins   [] Non-healing ulcers Pulmonary:   [] Uses home oxygen   []   Productive cough   [] Hemoptysis   [] Wheeze  [] COPD   [] Asthma Neurologic:  [] Dizziness   [] Seizures   [] History of stroke   [] History of TIA  [] Aphasia   [] Vissual changes   [] Weakness or numbness in arm   [] Weakness or numbness in leg Musculoskeletal:   [] Joint swelling   [] Joint pain   [] Low back pain Hematologic:  [] Easy bruising  [] Easy bleeding   [] Hypercoagulable state   [] Anemic Gastrointestinal:  [] Diarrhea   [] Vomiting  [x] Gastroesophageal reflux/heartburn   [] Difficulty swallowing. Genitourinary:  [] Chronic kidney disease   [] Difficult urination  [] Frequent urination   [] Blood in urine Skin:  [] Rashes   [] Ulcers  Psychological:  [] History of anxiety   []  History of  major depression.  Physical Examination  Vitals:   03/25/20 1345  BP: 118/69  Pulse: (!) 50  Resp: 16  Weight: 200 lb (90.7 kg)   Body mass index is 30.41 kg/m. Gen: WD/WN, NAD Head: [place]/AT, No temporalis wasting.  Ear/Nose/Throat: Hearing grossly intact, nares w/o erythema or drainage Eyes: PER, EOMI, sclera nonicteric.  Neck: Supple, no large masses.   Pulmonary:  Good air movement, no audible wheezing bilaterally, no use of accessory muscles.  Cardiac: RRR, no JVD Vascular:  Incisional scar well healed, tongue does deviate to the right Vessel Right Left  Radial Palpable Palpable  Carotid Palpable Palpable  Gastrointestinal: Non-distended. No guarding/no peritoneal signs.  Musculoskeletal: M/S 5/5 throughout.  No deformity or atrophy.  Neurologic: right hypoglossal nerve weakness.  Speech is fluent. Motor exam as listed above. Psychiatric: Judgment intact, Mood & affect appropriate for pt's clinical situation. Dermatologic: No rashes or ulcers noted.  No changes consistent with cellulitis.   CBC Lab Results  Component Value Date   WBC 19.3 (H) 08/10/2019   HGB 15.7 08/10/2019   HCT 46.6 08/10/2019   MCV 94.1 08/10/2019   PLT 201 08/10/2019    BMET    Component Value Date/Time   NA 140 08/10/2019 0340   K 5.3 (H) 08/10/2019 0340   CL 105 08/10/2019 0340   CO2 29 08/10/2019 0340   GLUCOSE 147 (H) 08/10/2019 0340   BUN 16 08/10/2019 0340   CREATININE 1.24 08/10/2019 0340   CALCIUM 9.2 08/10/2019 0340   GFRNONAA 55 (L) 08/10/2019 0340   GFRAA >60 08/10/2019 0340   CrCl cannot be calculated (Patient's most recent lab result is older than the maximum 21 days allowed.).  COAG Lab Results  Component Value Date   INR 1.0 08/08/2019    Radiology VAS US CAROTID  Result Date: 03/25/2020 Carotid Arterial Duplex Study Indications:       Carotid artery disease. Other Factors:     Right CEA 08/09/2019. Comparison Study:  09/21/2019 Performing Technologist: Charlane Ferretti RT (R)(VS)  Examination Guidelines: A complete evaluation includes B-mode imaging, spectral Doppler, color Doppler, and power Doppler as needed of all accessible portions of each vessel. Bilateral testing is considered an integral part of a complete examination. Limited examinations for reoccurring indications may be performed as noted.  Right Carotid Findings: +----------+--------+--------+--------+------------------+--------------------+           PSV cm/sEDV cm/sStenosisPlaque DescriptionComments             +----------+--------+--------+--------+------------------+--------------------+ CCA Prox  80      15                                                     +----------+--------+--------+--------+------------------+--------------------+  CCA Mid   89      21                                intimal thickening   +----------+--------+--------+--------+------------------+--------------------+ CCA Distal80      18                                intimal thickening   +----------+--------+--------+--------+------------------+--------------------+ ICA Prox  86      19                                ICA/CCA ratio = 1.21 +----------+--------+--------+--------+------------------+--------------------+ ICA Mid   55      20                                                     +----------+--------+--------+--------+------------------+--------------------+ ICA Distal97      22                                                     +----------+--------+--------+--------+------------------+--------------------+ ECA       280     36                                                     +----------+--------+--------+--------+------------------+--------------------+ +----------+--------+-------+-----------+-------------------+           PSV cm/sEDV cmsDescribe   Arm Pressure (mmHG) +----------+--------+-------+-----------+-------------------+ [id number]             Multiphasic                    +----------+--------+-------+-----------+-------------------+ +---------+--------+---+--------+--+---------+ VertebralPSV cm/s181EDV cm/s58Antegrade +---------+--------+---+--------+--+---------+ Left Carotid Findings: +----------+--------+--------+--------+------------------+------------------+           PSV cm/sEDV cm/sStenosisPlaque DescriptionComments           +----------+--------+--------+--------+------------------+------------------+ CCA Prox  133     23                                tortuous           +----------+--------+--------+--------+------------------+------------------+ CCA Mid   104     28                                                   +----------+--------+--------+--------+------------------+------------------+ CCA Distal83      21              calcific                             +----------+--------+--------+--------+------------------+------------------+ ICA Prox  58      18  ICA/CCAratio = .80 +----------+--------+--------+--------+------------------+------------------+ ICA Mid   79      25                                                   +----------+--------+--------+--------+------------------+------------------+ ICA Distal42      15                                                   +----------+--------+--------+--------+------------------+------------------+ ECA       235     31                                                   +----------+--------+--------+--------+------------------+------------------+ +----------+--------+--------+-----------+-------------------+           PSV cm/sEDV cm/sDescribe   Arm Pressure (mmHG) +----------+--------+--------+-----------+-------------------+ [id number]             Multiphasic                    +----------+--------+--------+-----------+-------------------+ +---------+--------+--+--------+--+---------+  VertebralPSV cm/s76EDV cm/s23Antegrade +---------+--------+--+--------+--+---------+ Summary: Right Carotid: Velocities in the right ICA are consistent with a 1-39% stenosis.                The ECA appears >50% stenosed. Left Carotid: Velocities in the left ICA are consistent with a 1-39% stenosis.               The ECA appears >50% stenosed. Vertebrals:  Bilateral vertebral arteries demonstrate antegrade flow. Subclavians: Normal flow hemodynamics were seen in bilateral subclavian              arteries. *See table(s) above for measurements and observations.  Electronically signed by Hortencia Pilar MD on 03/25/2020 at 4:52:49 PM.    Final      Assessment/Plan 1. Bilateral carotid artery stenosis Recommend:  The patient is s/p successful right CEA  Carotid duplex shows widely patent right ICA and <72% LICA stenosis  Continue antiplatelet therapy as prescribed Continue management of CAD, HTN and Hyperlipidemia Healthy heart diet,  encouraged exercise at least 4 times per week  Follow up in 6 months with duplex ultrasound and physical exam based on the patient's carotid surgery and <40% stenosis of the left carotid artery   - VAS US CAROTID; Future  2. Injury of hypoglossal nerve, sequela With respect to the hypoglossal nerve I have encouraged him to continue to work with speech therapy.  I would also like to give this situation more time hopefully we will see a significant improvement in nerve function.  It is of interest that he did not exhibit hypoglossal deficit in the recovery room or at the first postop visit.  The other 2 troubles that he is noting seem very unique they do not correlate with any of the cranial nerves associated with carotid surgeries.  He does note that the numbness around his ear and cheek which is typical after surgery is significantly improved so I am hoping that with time both the point specific pain in the salivation will also improve with time.  3.  Gastroesophageal reflux disease without esophagitis  Continue PPI as already ordered, this medication has been reviewed and there are no changes at this time.  Avoidence of caffeine and alcohol  Moderate elevation of the head of the bed   4. Mixed hyperlipidemia Continue statin as ordered and reviewed, no changes at this time   5. Primary osteoarthritis of left knee Continue NSAID medications as already ordered, these medications have been reviewed and there are no changes at this time.  Continued activity and therapy was stressed.     Hortencia Pilar, MD  03/27/2020 11:15 AM

## 2020-04-11 ENCOUNTER — Encounter: Payer: 59 | Admitting: Dermatology

## 2020-05-28 ENCOUNTER — Other Ambulatory Visit: Payer: Self-pay

## 2020-05-28 ENCOUNTER — Ambulatory Visit (INDEPENDENT_AMBULATORY_CARE_PROVIDER_SITE_OTHER): Payer: Medicare Other | Admitting: Dermatology

## 2020-05-28 DIAGNOSIS — D229 Melanocytic nevi, unspecified: Secondary | ICD-10-CM | POA: Diagnosis not present

## 2020-05-28 DIAGNOSIS — L821 Other seborrheic keratosis: Secondary | ICD-10-CM

## 2020-05-28 DIAGNOSIS — Z1283 Encounter for screening for malignant neoplasm of skin: Secondary | ICD-10-CM | POA: Diagnosis not present

## 2020-05-28 DIAGNOSIS — L814 Other melanin hyperpigmentation: Secondary | ICD-10-CM | POA: Diagnosis not present

## 2020-05-28 DIAGNOSIS — D1800 Hemangioma unspecified site: Secondary | ICD-10-CM

## 2020-05-28 DIAGNOSIS — L578 Other skin changes due to chronic exposure to nonionizing radiation: Secondary | ICD-10-CM

## 2020-05-28 DIAGNOSIS — Z85828 Personal history of other malignant neoplasm of skin: Secondary | ICD-10-CM

## 2020-05-28 DIAGNOSIS — L570 Actinic keratosis: Secondary | ICD-10-CM

## 2020-05-28 DIAGNOSIS — Z872 Personal history of diseases of the skin and subcutaneous tissue: Secondary | ICD-10-CM

## 2020-05-28 NOTE — Progress Notes (Signed)
Follow-Up Visit   Subjective  Gregory Stark is a 79 y.o. male who presents for the following: FBSE.  Patient here for full body skin exam and skin cancer screening. He has a history of BCC and AK's. Nothing new or changing today that patient is aware of. There are 2 spots behind left ear and one at left temple he would like looked at.   The following portions of the chart were reviewed this encounter and updated as appropriate:  Tobacco  Allergies  Meds  Problems  Med Hx  Surg Hx  Fam Hx      Review of Systems:  No other skin or systemic complaints except as noted in HPI or Assessment and Plan.  Objective  Well appearing patient in no apparent distress; mood and affect are within normal limits.  A full examination was performed including scalp, head, eyes, ears, nose, lips, neck, chest, axillae, abdomen, back, buttocks, bilateral upper extremities, bilateral lower extremities, hands, feet, fingers, toes, fingernails, and toenails. All findings within normal limits unless otherwise noted below.    Assessment & Plan     Lentigines - Scattered tan macules - Discussed due to sun exposure - Benign, observe - Call for any changes  Seborrheic Keratoses - Stuck-on, waxy, tan-brown papules and plaques  - Discussed benign etiology and prognosis. - Observe - Call for any changes  Melanocytic Nevi - Tan-brown and/or pink-flesh-colored symmetric macules and papules - Benign appearing on exam today - Observation - Call clinic for new or changing moles - Recommend daily use of broad spectrum spf 30+ sunscreen to sun-exposed areas.   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  The purpose is to decrease the number of clinically evident and subclinical PreCancerous lesions to prevent progression to development of skin cancer by chemically destroying early precancer changes that may or may not be visible.  It has been shown to reduce the risk of developing skin cancer in the treated area. As a result of treatment, redness, scaling, crusting, and open sores may occur during treatment course. One or more than one of these methods may be used and may have to be used several times to control, suppress and eliminate the PreCancerous changes. Discussed treatment course, expected reaction, and possible side effects. - Start 5FU/calcipotriene twice daily for 4 days then Vaseline only. Patient will start after the holidays.  Handout with treatment instructions and expected reaction provided.  Prescription sent to [facility] pharmacy.  Skin cancer screening performed today.  History of PreCancerous Actinic Keratosis   - sites of PreCancerous Actinic Keratosis clear today. - these may recur and new lesions may form requiring treatment to prevent transformation into skin cancer - observe for new or changing spots and contact [place] for appointment if occur - photoprotection with sun protective clothing; sunglasses and broad spectrum sunscreen with SPF of at least 30 + and frequent self skin exams recommended - yearly exams by a dermatologist recommended for persons with history of PreCancerous Actinic Keratoses  History of Basal Cell Carcinoma of the Skin - No  evidence of recurrence today at right chest - Recommend regular full body skin exams - Recommend daily broad spectrum sunscreen SPF 30+ to sun-exposed areas, reapply every  2 hours as needed.  - Call if any new or changing lesions are noted between office visits   Return in about 3 months (around 08/28/2020) for AK follow up s/p topical field treatment, 6 mo FBSE.  [name], RMA, am acting as scribe for Forest Gleason, MD .   Documentation: I have reviewed the above documentation for accuracy and completeness, and I agree with the above.  Forest Gleason, MD

## 2020-05-28 NOTE — Patient Instructions (Addendum)
Melanoma ABCDEs  Melanoma is the most dangerous type of skin cancer, and is the leading cause of death from skin disease.  You are more likely to develop melanoma if you:  Have light-colored skin, light-colored eyes, or red or blond hair  Spend a lot of time in the sun  Tan regularly, either outdoors or in a tanning bed  Have had blistering sunburns, especially during childhood  Have a close family member who has had a melanoma  Have atypical moles or large birthmarks  Early detection of melanoma is key since treatment is typically straightforward and cure rates are extremely high if we catch it early.   The first sign of melanoma is often a change in a mole or a new dark spot.  The ABCDE system is a way of remembering the signs of melanoma.  A for asymmetry:  The two halves do not match. B for border:  The edges of the growth are irregular. C for color:  A mixture of colors are present instead of an even brown color. D for diameter:  Melanomas are usually (but not always) greater than 52mm - the size of a pencil eraser. E for evolution:  The spot keeps changing in size, shape, and color.  Please check your skin once per month between visits. You can use a small mirror in front and a large mirror behind you to keep an eye on the back side or your body.   If you see any new or changing lesions before your next follow-up, please call to schedule a visit.  Please continue daily skin protection including broad spectrum sunscreen SPF 30+ to sun-exposed areas, reapplying every 2 hours as needed when you're outdoors.   5-Fluorouracil/Calcipotriene Patient Education   Actinic keratoses are the dry, red scaly spots on the skin caused by sun damage. A portion of these spots can turn into skin cancer with time, and treating them can help prevent development of skin cancer.   Treatment of these spots requires removal of the defective skin cells. There are various ways to remove actinic  keratoses, including freezing with liquid nitrogen, treatment with creams, or treatment with a blue light procedure in the office.   5-fluorouracil cream is a topical cream used to treat actinic keratoses. It works by interfering with the growth of abnormal fast-growing skin cells, such as actinic keratoses. These cells peel off and are replaced by healthy ones.   5-fluorouracil/calcipotriene is a combination of the 5-fluorouracil cream with a vitamin D analog cream called calcipotriene. The calcipotriene alone does not treat actinic keratoses. However, when it is combined with 5-fluorouracil, it helps the 5-fluorouracil treat the actinic keratoses much faster so that the same results can be achieved with a much shorter treatment time.  INSTRUCTIONS FOR 5-FLUOROURACIL/CALCIPOTRIENE CREAM:   5-fluorouracil/calcipotriene cream typically only needs to be used for 4-7 days. A thin layer should be applied twice a day to the treatment areas recommended by your physician.   If your physician prescribed you separate tubes of 5-fluourouracil and calcipotriene, apply a thin layer of 5-fluorouracil followed by a thin layer of calcipotriene.   Avoid contact with your eyes, nostrils, and mouth. Do not use 5-fluorouracil/calcipotriene cream on infected or open wounds.   You will develop redness, irritation and some crusting at areas where you have pre-cancer damage/actinic keratoses. IF YOU DEVELOP PAIN, BLEEDING, OR SIGNIFICANT CRUSTING, STOP THE TREATMENT EARLY - you have already gotten a good response and the actinic keratoses should clear up well.  Wash your hands after applying 5-fluorouracil 5% cream on your skin.   A moisturizer or sunscreen with a minimum SPF 30 should be applied each morning.   Once you have finished the treatment, you can apply a thin layer of Vaseline twice a day to irritated areas to soothe and calm the areas more quickly. If you experience significant discomfort, contact your  physician.  For some patients it is necessary to repeat the treatment for best results.  SIDE EFFECTS: When using 5-fluorouracil/calcipotriene cream, you may have mild irritation, such as redness, dryness, swelling, or a mild burning sensation. This usually resolves within 2 weeks. The more actinic keratoses you have, the more redness and inflammation you can expect during treatment. Eye irritation has been reported rarely. If this occurs, please let us know.  If you have any trouble using this cream, please call the office. If you have any other questions about this information, please do not hesitate to ask me before you leave the office.  Instructions for [facility]  One or more of your medications was sent to the [facility] mail order compounding pharmacy. You will receive an email from them and can purchase the medicine through that link. It will then be mailed to your home at the address you confirmed. If for any reason you do not receive an email from them, please check your spam folder. If you still do not find the email, please let us know. [facility] phone number is [phone].

## 2020-06-03 ENCOUNTER — Encounter: Payer: Self-pay | Admitting: Dermatology

## 2020-08-29 ENCOUNTER — Ambulatory Visit: Payer: Medicare Other | Admitting: Dermatology

## 2020-09-15 ENCOUNTER — Encounter (INDEPENDENT_AMBULATORY_CARE_PROVIDER_SITE_OTHER): Payer: Self-pay

## 2020-09-19 ENCOUNTER — Ambulatory Visit (INDEPENDENT_AMBULATORY_CARE_PROVIDER_SITE_OTHER): Payer: Medicare Other

## 2020-09-19 ENCOUNTER — Encounter (INDEPENDENT_AMBULATORY_CARE_PROVIDER_SITE_OTHER): Payer: Self-pay | Admitting: Vascular Surgery

## 2020-09-19 ENCOUNTER — Ambulatory Visit (INDEPENDENT_AMBULATORY_CARE_PROVIDER_SITE_OTHER): Payer: Medicare Other | Admitting: Vascular Surgery

## 2020-09-19 ENCOUNTER — Other Ambulatory Visit: Payer: Self-pay

## 2020-09-19 VITALS — BP 145/76 | HR 84 | Resp 16 | Wt 202.0 lb

## 2020-09-19 DIAGNOSIS — E782 Mixed hyperlipidemia: Secondary | ICD-10-CM

## 2020-09-19 DIAGNOSIS — I6523 Occlusion and stenosis of bilateral carotid arteries: Secondary | ICD-10-CM | POA: Diagnosis not present

## 2020-09-19 DIAGNOSIS — S04899S Injury of other cranial nerves, unspecified side, sequela: Secondary | ICD-10-CM

## 2020-09-19 DIAGNOSIS — I10 Essential (primary) hypertension: Secondary | ICD-10-CM | POA: Diagnosis not present

## 2020-09-19 NOTE — Progress Notes (Signed)
MRN : [id number]  Gregory Stark is a 80 y.o. (22-Jul-1940) male who presents with chief complaint of  Chief Complaint  Patient presents with  . Carotid    6 month follow up  .  History of Present Illness:   The patient is seen for follow up evaluation of carotid stenosis status post right carotid endarterectomy on 08/09/2019.  There were no post operative problems or complications related to the surgery.  The patient describes a sharp but brief pain over the right mastoid, behind the ear when he begins eating.  It resolves with chewing.  He also notes his tongue is deviating to the right and has had speech therapy to help with this.  He states he is having excessive salivation when he see food from the right side as well. These symptoms have persisted and are increasingly bothersome to him.  The patient denies interval amaurosis fugax. There is no recent history of TIA symptoms or focal motor deficits. There is no prior documented CVA.  The patient denies headache.  The patient is taking enteric-coated aspirin 81 mg daily.  The patient has a history of coronary artery disease, no recent episodes of angina or shortness of breath. The patient denies PAD or claudication symptoms. There is a history of hyperlipidemia which is being treated with a statin.   Carotid duplex shows widely patent right ICA and <14% LICA stenosis.  This is unchanged compared to the previous ultrasound  Current Meds  Medication Sig  . lisinopril (ZESTRIL) 10 MG tablet Take 10 mg by mouth at bedtime.   . pravastatin (PRAVACHOL) 40 MG tablet Take 40 mg by mouth at bedtime.   . Vitamin D, Ergocalciferol, (DRISDOL) 1.25 MG (50000 UT) CAPS capsule Take 50,000 Units by mouth every 7 (seven) days.    Past Medical History:  Diagnosis Date  . Actinic keratosis   . Basal cell carcinoma 03/23/2019   Right chest. Superficial and nodular patterns. Tx: EDC  . Cancer ([place])    chest  . Gastroesophageal reflux  disease   . Hyperlipidemia   . Hypertension   . Peripheral vascular disease [facility])     Past Surgical History:  Procedure Laterality Date  . arthroscoppy knee Left   . CATARACT EXTRACTION, BILATERAL    . ENDARTERECTOMY Right 08/09/2019   Procedure: ENDARTERECTOMY CAROTID;  Surgeon: Katha Cabal, MD;  Location: [facility] ORS;  Service: Vascular;  Laterality: Right;    Social History Social History   Tobacco Use  . Smoking status: Never Smoker  . Smokeless tobacco: Never Used  Vaping Use  . Vaping Use: Never used  Substance Use Topics  . Alcohol use: Yes    Alcohol/week: 4.0 standard drinks    Types: 3 Shots of liquor, 1 Glasses of wine per week    Comment: socially  . Drug use: Never    Family History Family History  Problem Relation Age of Onset  . Breast cancer Mother   . Macular degeneration Mother   . Colon cancer Father   . Melanoma Sister   . Lupus Sister     No Known Allergies   REVIEW OF SYSTEMS (Negative unless checked)  Constitutional: [] Weight loss  [] Fever  [] Chills Cardiac: [] Chest pain   [] Chest pressure   [] Palpitations   [] Shortness of breath when laying flat   [] Shortness of breath with exertion. Vascular:  [] Pain in legs with walking   [] Pain in legs at rest  [] History of DVT   [] Phlebitis   []   Swelling in legs   [] Varicose veins   [] Non-healing ulcers Pulmonary:   [] Uses home oxygen   [] Productive cough   [] Hemoptysis   [] Wheeze  [] COPD   [] Asthma Neurologic:  [] Dizziness   [] Seizures   [] History of stroke   [] History of TIA  [] Aphasia   [] Vissual changes   [] Weakness or numbness in arm   [] Weakness or numbness in leg Musculoskeletal:   [] Joint swelling   [] Joint pain   [] Low back pain Hematologic:  [] Easy bruising  [] Easy bleeding   [] Hypercoagulable state   [] Anemic Gastrointestinal:  [] Diarrhea   [] Vomiting  [] Gastroesophageal reflux/heartburn   [] Difficulty swallowing. Genitourinary:  [] Chronic kidney disease   [] Difficult urination  [] Frequent  urination   [] Blood in urine Skin:  [] Rashes   [] Ulcers  Psychological:  [] History of anxiety   []  History of major depression.  Physical Examination  Vitals:   09/19/20 1435  BP: (!) 145/76  Pulse: 84  Resp: 16  Weight: 202 lb (91.6 kg)   Body mass index is 30.71 kg/m. Gen: WD/WN, NAD Head: [place]/AT, No temporalis wasting.  Ear/Nose/Throat: Hearing grossly intact, nares w/o erythema or drainage Eyes: PER, EOMI, sclera nonicteric.  Neck: Supple, no large masses.   Pulmonary:  Good air movement, no audible wheezing bilaterally, no use of accessory muscles.  Cardiac: RRR, no JVD Vascular: well healed right CEA scar, No bruits Vessel Right Left  Radial Palpable Palpable  Carotid Palpable Palpable  Gastrointestinal: Non-distended. No guarding/no peritoneal signs.  Musculoskeletal: M/S 5/5 throughout.  No deformity or atrophy.  Neurologic: CN 2-12 intact. Symmetrical.  Speech is fluent. Motor exam as listed above. Psychiatric: Judgment intact, Mood & affect appropriate for pt's clinical situation. Dermatologic: No rashes or ulcers noted.  No changes consistent with cellulitis.   CBC Lab Results  Component Value Date   WBC 19.3 (H) 08/10/2019   HGB 15.7 08/10/2019   HCT 46.6 08/10/2019   MCV 94.1 08/10/2019   PLT 201 08/10/2019    BMET    Component Value Date/Time   NA 140 08/10/2019 0340   K 5.3 (H) 08/10/2019 0340   CL 105 08/10/2019 0340   CO2 29 08/10/2019 0340   GLUCOSE 147 (H) 08/10/2019 0340   BUN 16 08/10/2019 0340   CREATININE 1.24 08/10/2019 0340   CALCIUM 9.2 08/10/2019 0340   GFRNONAA 55 (L) 08/10/2019 0340   GFRAA >60 08/10/2019 0340   CrCl cannot be calculated (Patient's most recent lab result is older than the maximum 21 days allowed.).  COAG Lab Results  Component Value Date   INR 1.0 08/08/2019    Radiology No results found.   Assessment/Plan 1. Bilateral carotid artery stenosis Recommend:  The patient is s/p successful right  CEA  Carotid duplex shows widely patent right ICA and <16% LICA stenosis  Continue antiplatelet therapy as prescribed Continue management of CAD, HTN and Hyperlipidemia Healthy heart diet,  encouraged exercise at least 4 times per week  Follow up in 6 months with duplex ultrasound and physical exam based on the patient's carotid surgery and <40% stenosis of the left carotid artery   - VAS US CAROTID; Future  2. Injury of hypoglossal nerve, sequela I will ask Dr Deatra Canter Zomorodi to evaluate his nerve injury and see if reconstruction is feasible  3. Mixed hyperlipidemia Continue statin as ordered and reviewed, no changes at this time  4. HTN, goal below 140/80 Continue antihypertensive medications as already ordered, these medications have been reviewed and there are no changes at  this time.    Hortencia Pilar, MD  09/19/2020 7:41 PM

## 2020-09-23 ENCOUNTER — Encounter (INDEPENDENT_AMBULATORY_CARE_PROVIDER_SITE_OTHER): Payer: Self-pay

## 2020-10-03 ENCOUNTER — Other Ambulatory Visit: Payer: Self-pay

## 2020-10-03 ENCOUNTER — Encounter: Payer: Self-pay | Admitting: Dermatology

## 2020-10-03 ENCOUNTER — Ambulatory Visit (INDEPENDENT_AMBULATORY_CARE_PROVIDER_SITE_OTHER): Payer: Medicare Other | Admitting: Dermatology

## 2020-10-03 DIAGNOSIS — L578 Other skin changes due to chronic exposure to nonionizing radiation: Secondary | ICD-10-CM

## 2020-10-03 DIAGNOSIS — L821 Other seborrheic keratosis: Secondary | ICD-10-CM

## 2020-10-03 NOTE — Progress Notes (Signed)
   Follow-Up Visit   Subjective  Gregory Stark is a 80 y.o. male who presents for the following: Follow-up (Patient is here today for follow up on ak on face. He was prescribed 5 fluorouracil/calcipotriene cream at last visit to use on affected areas. He reports he was very happy with the results after using cream.  He would like to ask about a spot on r/l ear today he noticed. ).  The following portions of the chart were reviewed this encounter and updated as appropriate:  Tobacco  Allergies  Meds  Problems  Med Hx  Surg Hx  Fam Hx      Objective  Well appearing patient in no apparent distress; mood and affect are within normal limits.  A focused examination was performed including face and ears . Relevant physical exam findings are noted in the Assessment and Plan.   Assessment & Plan   Actinic Damage - Severe, confluent actinic changes with pre-cancerous actinic keratoses  - Severe, chronic, not at goal, secondary to cumulative UV radiation exposure over time. With good improvement to left face s/p 5FU/calcipotriene therapy but with progression at right cheek and temple and forehead - diffuse scaly erythematous macules and papules with underlying dyspigmentation - Discussed Prescription "Field Treatment" for Severe, Chronic Confluent Actinic Changes with Pre-Cancerous Actinic Keratoses Field treatment involves treatment of an entire area of skin that has confluent Actinic Changes (Sun/ Ultraviolet light damage) and PreCancerous Actinic Keratoses by method of PhotoDynamic Therapy (PDT) and/or prescription Topical Chemotherapy agents such as 5-fluorouracil, 5-fluorouracil/calcipotriene, and/or imiquimod.  The purpose is to decrease the number of clinically evident and subclinical PreCancerous lesions to prevent progression to development of skin cancer by chemically destroying early precancer changes that may or may not be visible.  It has been shown to reduce the risk of developing  skin cancer in the treated area. As a result of treatment, redness, scaling, crusting, and open sores may occur during treatment course. One or more than one of these methods may be used and may have to be used several times to control, suppress and eliminate the PreCancerous changes. Discussed treatment course, expected reaction, and possible side effects. - Recommend daily broad spectrum sunscreen SPF 30+ to sun-exposed areas, reapply every 2 hours as needed.  - Staying in the shade or wearing long sleeves, sun glasses (UVA+UVB protection) and wide brim hats (4-inch brim around the entire circumference of the hat) are also recommended. - Call for new or changing lesions. - Start 5-fluorouracil/calcipotriene cream twice a day for 4 days (can use to up to 7 days to achieve expected redness and crusting) to affected areas including forehead. Prescription sent to [facility]. Patient provided with handout reviewing treatment course and side effects and advised to call or message [place] on MyChart with any concerns.  Seborrheic Keratoses - Stuck-on, waxy, tan-brown papules and/or plaques  - Benign-appearing - Discussed benign etiology and prognosis. - Observe - Call for any changes  Return for follow up as already scheduled .  I, Ruthell Rummage, CMA, am acting as scribe for Forest Gleason, MD.  Documentation: I have reviewed the above documentation for accuracy and completeness, and I agree with the above.  Forest Gleason, MD

## 2020-10-03 NOTE — Patient Instructions (Addendum)
Start 5-fluorouracil/calcipotriene cream twice a day for 4 days (can use to up to 7 days with it being old prescription) to affected areas including forehead. Prescription sent to [facility]. Patient advised they will receive an email to purchase the medication online and have it sent to their home. Patient provided with handout reviewing treatment course and side effects and advised to call or message [place] on MyChart with any concerns.  Recommend daily broad spectrum sunscreen SPF 30+ to sun-exposed areas, reapply every 2 hours as needed. Call for new or changing lesions.  Staying in the shade or wearing long sleeves, sun glasses (UVA+UVB protection) and wide brim hats (4-inch brim around the entire circumference of the hat) are also recommended for sun protection.    Recommend taking Heliocare sun protection supplement daily in sunny weather for additional sun protection. For maximum protection on the sunniest days, you can take up to 2 capsules of regular Heliocare OR take 1 capsule of Heliocare Ultra. For prolonged exposure (such as a full day in the sun), you can repeat your dose of the supplement 4 hours after your first dose. Heliocare can be purchased at [facility] or at [email or web address].    If you have any questions or concerns for your doctor, please call our main line at [phone] and press option 4 to reach your doctor's medical assistant. If no one answers, please leave a voicemail as directed and we will return your call as soon as possible. Messages left after 4 pm will be answered the following business day.   You may also send [place] a message via [employer]. We typically respond to MyChart messages within 1-2 business days.  For prescription refills, please ask your pharmacy to contact our office. Our fax number is [phone].  If you have an urgent issue when the clinic is closed that cannot wait until the next business day, you can page your doctor  at the number below.    Please note that while we do our best to be available for urgent issues outside of office hours, we are not available 24/7.   If you have an urgent issue and are unable to reach [place], you may choose to seek medical care at your doctor's office, retail clinic, urgent care center, or emergency room.  If you have a medical emergency, please immediately call 911 or go to the emergency department.  Pager Numbers  - Dr. Nehemiah Massed: [phone]  - Dr. Laurence Ferrari: [phone]  - Dr. Nicole Kindred: [phone]  In the event of inclement weather, please call our main line at [phone] for an update on the status of any delays or closures.  Dermatology Medication Tips: Please keep the boxes that topical medications come in in order to help keep track of the instructions about where and how to use these. Pharmacies typically print the medication instructions only on the boxes and not directly on the medication tubes.   If your medication is too expensive, please contact our office at [phone] option 4 or send [place] a message through [employer].   We are unable to tell what your co-pay for medications will be in advance as this is different depending on your insurance coverage. However, we may be able to find a substitute medication at lower cost or fill out paperwork to get insurance to cover a needed medication.   If a prior authorization is required to get your medication covered by your insurance company, please allow [place] 1-2 business days to complete this  process.  Drug prices often vary depending on where the prescription is filled and some pharmacies may offer cheaper prices.  The website www.goodrx.com contains coupons for medications through different pharmacies. The prices here do not account for what the cost may be with help from insurance (it may be cheaper with your insurance), but the website can give you the price if you did not use any insurance.  - You can print the  associated coupon and take it with your prescription to the pharmacy.  - You may also stop by our office during regular business hours and pick up a GoodRx coupon card.  - If you need your prescription sent electronically to a different pharmacy, notify our office through [facility] or by phone at [phone] option 4.

## 2020-10-17 ENCOUNTER — Encounter (INDEPENDENT_AMBULATORY_CARE_PROVIDER_SITE_OTHER): Payer: Self-pay

## 2020-11-19 DIAGNOSIS — E559 Vitamin D deficiency, unspecified: Secondary | ICD-10-CM | POA: Insufficient documentation

## 2020-11-28 ENCOUNTER — Other Ambulatory Visit: Payer: Self-pay

## 2020-11-28 ENCOUNTER — Ambulatory Visit (INDEPENDENT_AMBULATORY_CARE_PROVIDER_SITE_OTHER): Payer: Medicare Other | Admitting: Dermatology

## 2020-11-28 DIAGNOSIS — L578 Other skin changes due to chronic exposure to nonionizing radiation: Secondary | ICD-10-CM

## 2020-11-28 DIAGNOSIS — D1800 Hemangioma unspecified site: Secondary | ICD-10-CM

## 2020-11-28 DIAGNOSIS — Z85828 Personal history of other malignant neoplasm of skin: Secondary | ICD-10-CM | POA: Diagnosis not present

## 2020-11-28 DIAGNOSIS — L814 Other melanin hyperpigmentation: Secondary | ICD-10-CM

## 2020-11-28 DIAGNOSIS — D229 Melanocytic nevi, unspecified: Secondary | ICD-10-CM

## 2020-11-28 DIAGNOSIS — Z1283 Encounter for screening for malignant neoplasm of skin: Secondary | ICD-10-CM | POA: Diagnosis not present

## 2020-11-28 DIAGNOSIS — L409 Psoriasis, unspecified: Secondary | ICD-10-CM | POA: Diagnosis not present

## 2020-11-28 DIAGNOSIS — L821 Other seborrheic keratosis: Secondary | ICD-10-CM

## 2020-11-28 NOTE — Patient Instructions (Signed)
5-Fluorouracil/Calcipotriene Patient Education   Apply to behind right ear, vertex scalp, left forehead and left forearm  Actinic keratoses are the dry, red scaly spots on the skin caused by sun damage. A portion of these spots can turn into skin cancer with time, and treating them can help prevent development of skin cancer.   Treatment of these spots requires removal of the defective skin cells. There are various ways to remove actinic keratoses, including freezing with liquid nitrogen, treatment with creams, or treatment with a blue light procedure in the office.   5-fluorouracil cream is a topical cream used to treat actinic keratoses. It works by interfering with the growth of abnormal fast-growing skin cells, such as actinic keratoses. These cells peel off and are replaced by healthy ones.   5-fluorouracil/calcipotriene is a combination of the 5-fluorouracil cream with a vitamin D analog cream called calcipotriene. The calcipotriene alone does not treat actinic keratoses. However, when it is combined with 5-fluorouracil, it helps the 5-fluorouracil treat the actinic keratoses much faster so that the same results can be achieved with a much shorter treatment time.  INSTRUCTIONS FOR 5-FLUOROURACIL/CALCIPOTRIENE CREAM:   5-fluorouracil/calcipotriene cream typically only needs to be used for 4-7 days. A thin layer should be applied twice a day to the treatment areas recommended by your physician.   If your physician prescribed you separate tubes of 5-fluourouracil and calcipotriene, apply a thin layer of 5-fluorouracil followed by a thin layer of calcipotriene.   Avoid contact with your eyes, nostrils, and mouth. Do not use 5-fluorouracil/calcipotriene cream on infected or open wounds.   You will develop redness, irritation and some crusting at areas where you have pre-cancer damage/actinic keratoses. IF YOU DEVELOP PAIN, BLEEDING, OR SIGNIFICANT CRUSTING, STOP THE TREATMENT EARLY - you have  already gotten a good response and the actinic keratoses should clear up well.  Wash your hands after applying 5-fluorouracil 5% cream on your skin.   A moisturizer or sunscreen with a minimum SPF 30 should be applied each morning.   Once you have finished the treatment, you can apply a thin layer of Vaseline twice a day to irritated areas to soothe and calm the areas more quickly. If you experience significant discomfort, contact your physician.  For some patients it is necessary to repeat the treatment for best results.  SIDE EFFECTS: When using 5-fluorouracil/calcipotriene cream, you may have mild irritation, such as redness, dryness, swelling, or a mild burning sensation. This usually resolves within 2 weeks. The more actinic keratoses you have, the more redness and inflammation you can expect during treatment. Eye irritation has been reported rarely. If this occurs, please let us know.  If you have any trouble using this cream, please call the office. If you have any other questions about this information, please do not hesitate to ask me before you leave the office.  Melanoma ABCDEs  Melanoma is the most dangerous type of skin cancer, and is the leading cause of death from skin disease.  You are more likely to develop melanoma if you:  Have light-colored skin, light-colored eyes, or red or blond hair  Spend a lot of time in the sun  Tan regularly, either outdoors or in a tanning bed  Have had blistering sunburns, especially during childhood  Have a close family member who has had a melanoma  Have atypical moles or large birthmarks  Early detection of melanoma is key since treatment is typically straightforward and cure rates are extremely high if we catch it  early.   The first sign of melanoma is often a change in a mole or a new dark spot.  The ABCDE system is a way of remembering the signs of melanoma.  A for asymmetry:  The two halves do not match. B for border:  The edges  of the growth are irregular. C for color:  A mixture of colors are present instead of an even brown color. D for diameter:  Melanomas are usually (but not always) greater than 63mm - the size of a pencil eraser. E for evolution:  The spot keeps changing in size, shape, and color.  Please check your skin once per month between visits. You can use a small mirror in front and a large mirror behind you to keep an eye on the back side or your body.   If you see any new or changing lesions before your next follow-up, please call to schedule a visit.  Please continue daily skin protection including broad spectrum sunscreen SPF 30+ to sun-exposed areas, reapplying every 2 hours as needed when you're outdoors.   Recommend taking Heliocare sun protection supplement daily in sunny weather for additional sun protection. For maximum protection on the sunniest days, you can take up to 2 capsules of regular Heliocare OR take 1 capsule of Heliocare Ultra. For prolonged exposure (such as a full day in the sun), you can repeat your dose of the supplement 4 hours after your first dose. Heliocare can be purchased at [facility] or at [email or web address].   Seborrheic Keratosis  What causes seborrheic keratoses? Seborrheic keratoses are harmless, common skin growths that first appear during adult life.  As time goes by, more growths appear.  Some people may develop a large number of them.  Seborrheic keratoses appear on both covered and uncovered body parts.  They are not caused by sunlight.  The tendency to develop seborrheic keratoses can be inherited.  They vary in color from skin-colored to gray, brown, or even black.  They can be either smooth or have a rough, warty surface.   Seborrheic keratoses are superficial and look as if they were stuck on the skin.  Under the microscope this type of keratosis looks like layers upon layers of skin.  That is why at times the top layer may seem to fall off, but the rest  of the growth remains and re-grows.    Treatment Seborrheic keratoses do not need to be treated, but can easily be removed in the office.  Seborrheic keratoses often cause symptoms when they rub on clothing or jewelry.  Lesions can be in the way of shaving.  If they become inflamed, they can cause itching, soreness, or burning.  Removal of a seborrheic keratosis can be accomplished by freezing, burning, or surgery. If any spot bleeds, scabs, or grows rapidly, please return to have it checked, as these can be an indication of a skin cancer.  Actinic keratoses are precancerous spots that appear secondary to cumulative UV radiation exposure/sun exposure over time. They are chronic with expected duration over 1 year. A portion of actinic keratoses will progress to squamous cell carcinoma of the skin. It is not possible to reliably predict which spots will progress to skin cancer and so treatment is recommended to prevent development of skin cancer.  Recommend daily broad spectrum sunscreen SPF 30+ to sun-exposed areas, reapply every 2 hours as needed.  Recommend staying in the shade or wearing long sleeves, sun glasses (UVA+UVB protection) and wide brim hats (  4-inch brim around the entire circumference of the hat). Call for new or changing lesions.  If you have any questions or concerns for your doctor, please call our main line at [phone] and press option 4 to reach your doctor's medical assistant. If no one answers, please leave a voicemail as directed and we will return your call as soon as possible. Messages left after 4 pm will be answered the following business day.   You may also send [place] a message via [place]. We typically respond to MyChart messages within 1-2 business days.  For prescription refills, please ask your pharmacy to contact our office. Our fax number is [phone].  If you have an urgent issue when the clinic is closed that cannot wait until the next business day, you can  page your doctor at the number below.    Please note that while we do our best to be available for urgent issues outside of office hours, we are not available 24/7.   If you have an urgent issue and are unable to reach [place], you may choose to seek medical care at your doctor's office, retail clinic, urgent care center, or emergency room.  If you have a medical emergency, please immediately call 911 or go to the emergency department.  Pager Numbers  - Dr. Nehemiah Massed: [phone]  - Dr. Laurence Ferrari: [phone]  - Dr. Nicole Kindred: [phone]  In the event of inclement weather, please call our main line at [phone] for an update on the status of any delays or closures.  Dermatology Medication Tips: Please keep the boxes that topical medications come in in order to help keep track of the instructions about where and how to use these. Pharmacies typically print the medication instructions only on the boxes and not directly on the medication tubes.   If your medication is too expensive, please contact our office at [phone] option 4 or send [place] a message through [place].   We are unable to tell what your co-pay for medications will be in advance as this is different depending on your insurance coverage. However, we may be able to find a substitute medication at lower cost or fill out paperwork to get insurance to cover a needed medication.   If a prior authorization is required to get your medication covered by your insurance company, please allow [place] 1-2 business days to complete this process.  Drug prices often vary depending on where the prescription is filled and some pharmacies may offer cheaper prices.  The website www.goodrx.com contains coupons for medications through different pharmacies. The prices here do not account for what the cost may be with help from insurance (it may be cheaper with your insurance), but the website can give you the price if you did not use any insurance.  - You  can print the associated coupon and take it with your prescription to the pharmacy.  - You may also stop by our office during regular business hours and pick up a GoodRx coupon card.  - If you need your prescription sent electronically to a different pharmacy, notify our office through [facility] or by phone at [phone] option 4.

## 2020-11-28 NOTE — Progress Notes (Signed)
Follow-Up Visit   Subjective  Gregory Stark is a 80 y.o. male who presents for the following: 6 month follow up (Patient is here today for 6 month follow up and tbse. Patient has history of bcc on right chest (2020) and history of aks on forehead. ).  Patient here for full body skin exam and skin cancer screening.  The following portions of the chart were reviewed this encounter and updated as appropriate:  Tobacco  Allergies  Meds  Problems  Med Hx  Surg Hx  Fam Hx      Objective  Well appearing patient in no apparent distress; mood and affect are within normal limits.  A full examination was performed including scalp, head, eyes, ears, nose, lips, neck, chest, axillae, abdomen, back, buttocks, bilateral upper extremities, bilateral lower extremities, hands, feet, fingers, toes, fingernails, and toenails. All findings within normal limits unless otherwise noted below.  Objective  occipital scalp: Scaly pink plaques occipital scalp  Assessment & Plan  Psoriasis occipital scalp  Psoriasis is a chronic non-curable, but treatable genetic/hereditary disease that may have other systemic features affecting other organ systems such as joints (Psoriatic Arthritis). It is associated with an increased risk of inflammatory bowel disease, heart disease, non-alcoholic fatty liver disease, and depression.    Patient reports not bothered by   Patient deferred treatment for ketoconazole shampoo and solution at this time.  Will monitor for joint pain   Lentigines - Scattered tan macules - Due to sun exposure - Benign-appering, observe - Recommend daily broad spectrum sunscreen SPF 30+ to sun-exposed areas, reapply every 2 hours as needed. - Call for any changes  Seborrheic Keratoses - Stuck-on, waxy, tan-brown papules and/or plaques  - Benign-appearing - Discussed benign etiology and prognosis. - Observe - Call for any changes  Melanocytic Nevi - Tan-brown and/or  pink-flesh-colored symmetric macules and papules - Benign appearing on exam today - Observation - Call clinic for new or changing moles - Recommend daily use of broad spectrum spf 30+ sunscreen to sun-exposed areas.   Hemangiomas - Red papules - Discussed benign nature - Observe - Call for any changes  Actinic Damage - Chronic condition, secondary to cumulative UV/sun exposure - diffuse scaly erythematous macules with underlying dyspigmentation - Recommend daily broad spectrum sunscreen SPF 30+ to sun-exposed areas, reapply every 2 hours as needed.  - Staying in the shade or wearing long sleeves, sun glasses (UVA+UVB protection) and wide brim hats (4-inch brim around the entire circumference of the hat) are also recommended for sun protection.  - Call for new or changing lesions.  History of Basal Cell Carcinoma of the Skin - No evidence of recurrence today at right chest (2020) - Recommend regular full body skin exams - Recommend daily broad spectrum sunscreen SPF 30+ to sun-exposed areas, reapply every 2 hours as needed.  - Call if any new or changing lesions are noted between office visits  Actinic Damage - Severe, confluent actinic changes with pre-cancerous actinic keratoses  - Severe, chronic, not at goal, secondary to cumulative UV radiation exposure over time - diffuse scaly erythematous macules and papules with underlying dyspigmentation - Discussed Prescription "Field Treatment" for Severe, Chronic Confluent Actinic Changes with Pre-Cancerous Actinic Keratoses Field treatment involves treatment of an entire area of skin that has confluent Actinic Changes (Sun/ Ultraviolet light damage) and PreCancerous Actinic Keratoses by method of PhotoDynamic Therapy (PDT) and/or prescription Topical Chemotherapy agents such as 5-fluorouracil, 5-fluorouracil/calcipotriene, and/or imiquimod.  The purpose is to decrease the  number of clinically evident and subclinical PreCancerous lesions to  prevent progression to development of skin cancer by chemically destroying early precancer changes that may or may not be visible.  It has been shown to reduce the risk of developing skin cancer in the treated area. As a result of treatment, redness, scaling, crusting, and open sores may occur during treatment course. One or more than one of these methods may be used and may have to be used several times to control, suppress and eliminate the PreCancerous changes. Discussed treatment course, expected reaction, and possible side effects. - Recommend daily broad spectrum sunscreen SPF 30+ to sun-exposed areas, reapply every 2 hours as needed.  - Staying in the shade or wearing long sleeves, sun glasses (UVA+UVB protection) and wide brim hats (4-inch brim around the entire circumference of the hat) are also recommended. - Call for new or changing lesions.  right post auricular ear, vertex, left forehead  - Start 5-fluorouracil/calcipotriene cream twice a day for 7 days to affected areas including right post auricular ear, vertex, left forehead and left forearm  .  Skin cancer screening performed today.  Return in about 6 months (around 05/31/2021) for ak follow up , 1 year tbse .  I, Ruthell Rummage, CMA, am acting as scribe for Forest Gleason, MD.  Documentation: I have reviewed the above documentation for accuracy and completeness, and I agree with the above.  Forest Gleason, MD

## 2020-12-02 ENCOUNTER — Encounter: Payer: Self-pay | Admitting: Dermatology

## 2021-03-27 ENCOUNTER — Ambulatory Visit (INDEPENDENT_AMBULATORY_CARE_PROVIDER_SITE_OTHER): Payer: Medicare Other

## 2021-03-27 ENCOUNTER — Ambulatory Visit (INDEPENDENT_AMBULATORY_CARE_PROVIDER_SITE_OTHER): Payer: Medicare Other | Admitting: Vascular Surgery

## 2021-03-27 ENCOUNTER — Other Ambulatory Visit (INDEPENDENT_AMBULATORY_CARE_PROVIDER_SITE_OTHER): Payer: Self-pay | Admitting: Vascular Surgery

## 2021-03-27 ENCOUNTER — Other Ambulatory Visit: Payer: Self-pay

## 2021-03-27 VITALS — BP 134/84 | HR 63 | Ht 68.0 in | Wt 205.0 lb

## 2021-03-27 DIAGNOSIS — I10 Essential (primary) hypertension: Secondary | ICD-10-CM | POA: Diagnosis not present

## 2021-03-27 DIAGNOSIS — S04899S Injury of other cranial nerves, unspecified side, sequela: Secondary | ICD-10-CM | POA: Diagnosis not present

## 2021-03-27 DIAGNOSIS — I6523 Occlusion and stenosis of bilateral carotid arteries: Secondary | ICD-10-CM

## 2021-03-27 DIAGNOSIS — E782 Mixed hyperlipidemia: Secondary | ICD-10-CM

## 2021-03-27 NOTE — Progress Notes (Signed)
MRN : [id number]  Gregory Stark is a 80 y.o. (Nov 23, 1940) male who presents with chief complaint of follow up for my carotids.  History of Present Illness:   The patient is seen for follow up evaluation of carotid stenosis status post right carotid endarterectomy on 08/09/2019.   At the last visit the patient described a sharp but brief pain over the right mastoid, behind the ear when he begins eating.  It resolves with chewing.  He notes this is  a little better but certainly not resolved.  He also notes his tongue is deviating to the right and has had speech therapy to help with this.  He states he is having excessive salivation when he see food. These symptoms have persisted and are perhaps a bit better.   The patient denies interval amaurosis fugax. There is no recent history of TIA symptoms or focal motor deficits. There is no prior documented CVA.   The patient denies headache.   The patient is taking enteric-coated aspirin 81 mg daily.   The patient has a history of coronary artery disease, no recent episodes of angina or shortness of breath. The patient denies PAD or claudication symptoms. There is a history of hyperlipidemia which is being treated with a statin.    Carotid duplex shows widely patent right ICA and <00% LICA stenosis.  This is unchanged compared to the previous ultrasound  No outpatient medications have been marked as taking for the 03/27/21 encounter (Appointment) with Delana Meyer, Dolores Lory, MD.    Past Medical History:  Diagnosis Date   Actinic keratosis    Basal cell carcinoma 03/23/2019   Right chest. Superficial and nodular patterns. Tx: EDC   Cancer ([place])    chest   Gastroesophageal reflux disease    Hyperlipidemia    Hypertension    Peripheral vascular disease ([place])     Past Surgical History:  Procedure Laterality Date   arthroscoppy knee Left    CATARACT EXTRACTION, BILATERAL     ENDARTERECTOMY Right 08/09/2019   Procedure: ENDARTERECTOMY  CAROTID;  Surgeon: Katha Cabal, MD;  Location: [facility] ORS;  Service: Vascular;  Laterality: Right;    Social History Social History   Tobacco Use   Smoking status: Never   Smokeless tobacco: Never  Vaping Use   Vaping Use: Never used  Substance Use Topics   Alcohol use: Yes    Alcohol/week: 4.0 standard drinks    Types: 3 Shots of liquor, 1 Glasses of wine per week    Comment: socially   Drug use: Never    Family History Family History  Problem Relation Age of Onset   Breast cancer Mother    Macular degeneration Mother    Colon cancer Father    Melanoma Sister    Lupus Sister     No Known Allergies   REVIEW OF SYSTEMS (Negative unless checked)  Constitutional: [] Weight loss  [] Fever  [] Chills Cardiac: [] Chest pain   [] Chest pressure   [] Palpitations   [] Shortness of breath when laying flat   [] Shortness of breath with exertion. Vascular:  [] Pain in legs with walking   [] Pain in legs at rest  [] History of DVT   [] Phlebitis   [] Swelling in legs   [] Varicose veins   [] Non-healing ulcers Pulmonary:   [] Uses home oxygen   [] Productive cough   [] Hemoptysis   [] Wheeze  [] COPD   [] Asthma Neurologic:  [] Dizziness   [] Seizures   [] History of stroke   [] History of TIA  []   Aphasia   [] Vissual changes   [] Weakness or numbness in arm   [] Weakness or numbness in leg Musculoskeletal:   [] Joint swelling   [] Joint pain   [] Low back pain Hematologic:  [] Easy bruising  [] Easy bleeding   [] Hypercoagulable state   [] Anemic Gastrointestinal:  [] Diarrhea   [] Vomiting  [] Gastroesophageal reflux/heartburn   [] Difficulty swallowing. Genitourinary:  [] Chronic kidney disease   [] Difficult urination  [] Frequent urination   [] Blood in urine Skin:  [] Rashes   [] Ulcers  Psychological:  [] History of anxiety   []  History of major depression.  Physical Examination  There were no vitals filed for this visit. There is no height or weight on file to calculate BMI. Gen: WD/WN, NAD Head: /AT, No  temporalis wasting.  Ear/Nose/Throat: Hearing grossly intact, nares w/o erythema or drainage Eyes: PER, EOMI, sclera nonicteric.  Neck: Supple, no masses.  No bruit or JVD.  Pulmonary:  Good air movement, no audible wheezing, no use of accessory muscles.  Cardiac: RRR, normal S1, S2, no Murmurs. Vascular:  well healed right CEA incisional scar. Vessel Right Left  Radial Palpable Palpable  Carotid Palpable Palpable  Gastrointestinal: soft, non-distended. No guarding/no peritoneal signs.  Musculoskeletal: M/S 5/5 throughout.  No visible deformity.  Neurologic: Tongue deviates to the right.. Pain and light touch intact in extremities.  Symmetrical.  Speech is fluent. Motor exam as listed above. Psychiatric: Judgment intact, Mood & affect appropriate for pt's clinical situation. Dermatologic: No rashes or ulcers noted.  No changes consistent with cellulitis.   CBC Lab Results  Component Value Date   WBC 19.3 (H) 08/10/2019   HGB 15.7 08/10/2019   HCT 46.6 08/10/2019   MCV 94.1 08/10/2019   PLT 201 08/10/2019    BMET    Component Value Date/Time   NA 140 08/10/2019 0340   K 5.3 (H) 08/10/2019 0340   CL 105 08/10/2019 0340   CO2 29 08/10/2019 0340   GLUCOSE 147 (H) 08/10/2019 0340   BUN 16 08/10/2019 0340   CREATININE 1.24 08/10/2019 0340   CALCIUM 9.2 08/10/2019 0340   GFRNONAA 55 (L) 08/10/2019 0340   GFRAA >60 08/10/2019 0340   CrCl cannot be calculated (Patient's most recent lab result is older than the maximum 21 days allowed.).  COAG Lab Results  Component Value Date   INR 1.0 08/08/2019    Radiology No results found.   Assessment/Plan 1. Bilateral carotid artery stenosis Recommend:   The patient is s/p successful right CEA   Carotid duplex shows widely patent right ICA and <28% LICA stenosis   Continue antiplatelet therapy as prescribed Continue management of CAD, HTN and Hyperlipidemia Healthy heart diet,  encouraged exercise at least 4 times per  week   Follow up in 12 months with duplex ultrasound and physical exam based on the patient's carotid surgery and <40% stenosis of the left carotid artery  - VAS US CAROTID; Future  2. HTN, goal below 140/80 Continue antihypertensive medications as already ordered, these medications have been reviewed and there are no changes at this time.   3. Mixed hyperlipidemia Continue statin as ordered and reviewed, no changes at this time   4. Injury of hypoglossal nerve, sequela He was seen by [facility] and there is no plan for nerve reconstruction    Hortencia Pilar, MD  03/27/2021 1:03 PM

## 2021-03-30 ENCOUNTER — Encounter (INDEPENDENT_AMBULATORY_CARE_PROVIDER_SITE_OTHER): Payer: Self-pay | Admitting: Vascular Surgery

## 2021-06-05 ENCOUNTER — Other Ambulatory Visit: Payer: Self-pay

## 2021-06-05 ENCOUNTER — Ambulatory Visit (INDEPENDENT_AMBULATORY_CARE_PROVIDER_SITE_OTHER): Payer: Medicare Other | Admitting: Dermatology

## 2021-06-05 DIAGNOSIS — L80 Vitiligo: Secondary | ICD-10-CM | POA: Diagnosis not present

## 2021-06-05 DIAGNOSIS — L821 Other seborrheic keratosis: Secondary | ICD-10-CM

## 2021-06-05 DIAGNOSIS — Z1283 Encounter for screening for malignant neoplasm of skin: Secondary | ICD-10-CM | POA: Diagnosis not present

## 2021-06-05 DIAGNOSIS — L570 Actinic keratosis: Secondary | ICD-10-CM

## 2021-06-05 DIAGNOSIS — L814 Other melanin hyperpigmentation: Secondary | ICD-10-CM

## 2021-06-05 DIAGNOSIS — L578 Other skin changes due to chronic exposure to nonionizing radiation: Secondary | ICD-10-CM | POA: Diagnosis not present

## 2021-06-05 DIAGNOSIS — Z85828 Personal history of other malignant neoplasm of skin: Secondary | ICD-10-CM | POA: Diagnosis not present

## 2021-06-05 DIAGNOSIS — D229 Melanocytic nevi, unspecified: Secondary | ICD-10-CM

## 2021-06-05 DIAGNOSIS — D1801 Hemangioma of skin and subcutaneous tissue: Secondary | ICD-10-CM

## 2021-06-05 NOTE — Patient Instructions (Addendum)
Cryotherapy Aftercare  Wash gently with soap and water everyday.   Apply Vaseline and Band-Aid daily until healed.   Prior to procedure, discussed risks of blister formation, small wound, skin dyspigmentation, or rare scar following cryotherapy. Recommend Vaseline ointment to treated areas while healing.  Melanoma ABCDEs  Melanoma is the most dangerous type of skin cancer, and is the leading cause of death from skin disease.  You are more likely to develop melanoma if you: Have light-colored skin, light-colored eyes, or red or blond hair Spend a lot of time in the sun Tan regularly, either outdoors or in a tanning bed Have had blistering sunburns, especially during childhood Have a close family member who has had a melanoma Have atypical moles or large birthmarks  Early detection of melanoma is key since treatment is typically straightforward and cure rates are extremely high if we catch it early.   The first sign of melanoma is often a change in a mole or a new dark spot.  The ABCDE system is a way of remembering the signs of melanoma.  A for asymmetry:  The two halves do not match. B for border:  The edges of the growth are irregular. C for color:  A mixture of colors are present instead of an even brown color. D for diameter:  Melanomas are usually (but not always) greater than 6mm - the size of a pencil eraser. E for evolution:  The spot keeps changing in size, shape, and color.  Please check your skin once per month between visits. You can use a small mirror in front and a large mirror behind you to keep an eye on the back side or your body.   If you see any new or changing lesions before your next follow-up, please call to schedule a visit.  Please continue daily skin protection including broad spectrum sunscreen SPF 30+ to sun-exposed areas, reapplying every 2 hours as needed when you're outdoors.    If You Need Anything After Your Visit  If you have any questions or  concerns for your doctor, please call our main line at [phone] and press option 4 to reach your doctor's medical assistant. If no one answers, please leave a voicemail as directed and we will return your call as soon as possible. Messages left after 4 pm will be answered the following business day.   You may also send us a message via MyChart. We typically respond to MyChart messages within 1-2 business days.  For prescription refills, please ask your pharmacy to contact our office. Our fax number is [phone].  If you have an urgent issue when the clinic is closed that cannot wait until the next business day, you can page your doctor at the number below.    Please note that while we do our best to be available for urgent issues outside of office hours, we are not available 24/7.   If you have an urgent issue and are unable to reach us, you may choose to seek medical care at your doctor's office, retail clinic, urgent care center, or emergency room.  If you have a medical emergency, please immediately call 911 or go to the emergency department.  Pager Numbers  - Dr. Kowalski: [phone]  - Dr. Moye: [phone]  - Dr. Stewart: [phone]  In the event of inclement weather, please call our main line at [phone] for an update on the status of any delays or closures.  Dermatology Medication Tips: Please keep the boxes that   topical medications come in in order to help keep track of the instructions about where and how to use these. Pharmacies typically print the medication instructions only on the boxes and not directly on the medication tubes.   If your medication is too expensive, please contact our office at [phone] option 4 or send us a message through MyChart.   We are unable to tell what your co-pay for medications will be in advance as this is different depending on your insurance coverage. However, we may be able to find a substitute medication at lower cost or  fill out paperwork to get insurance to cover a needed medication.   If a prior authorization is required to get your medication covered by your insurance company, please allow us 1-2 business days to complete this process.  Drug prices often vary depending on where the prescription is filled and some pharmacies may offer cheaper prices.  The website www.goodrx.com contains coupons for medications through different pharmacies. The prices here do not account for what the cost may be with help from insurance (it may be cheaper with your insurance), but the website can give you the price if you did not use any insurance.  - You can print the associated coupon and take it with your prescription to the pharmacy.  - You may also stop by our office during regular business hours and pick up a GoodRx coupon card.  - If you need your prescription sent electronically to a different pharmacy, notify our office through [facility] MyChart or by phone at [phone] option 4.     Si Usted Necesita Algo Despus de Su Visita  Tambin puede enviarnos un mensaje a travs de MyChart. Por lo general respondemos a los mensajes de MyChart en el transcurso de 1 a 2 das hbiles.  Para renovar recetas, por favor pida a su farmacia que se ponga en contacto con nuestra oficina. Nuestro nmero de fax es el [phone].  Si tiene un asunto urgente cuando la clnica est cerrada y que no puede esperar hasta el siguiente da hbil, puede llamar/localizar a su doctor(a) al nmero que aparece a continuacin.   Por favor, tenga en cuenta que aunque hacemos todo lo posible para estar disponibles para asuntos urgentes fuera del horario de oficina, no estamos disponibles las 24 horas del da, los 7 das de la semana.   Si tiene un problema urgente y no puede comunicarse con nosotros, puede optar por buscar atencin mdica  en el consultorio de su doctor(a), en una clnica privada, en un centro de atencin urgente o en una sala  de emergencias.  Si tiene una emergencia mdica, por favor llame inmediatamente al 911 o vaya a la sala de emergencias.  Nmeros de bper  - Dr. Kowalski: [phone]  - Dra. Moye: [phone]  - Dra. Stewart: [phone]  En caso de inclemencias del tiempo, por favor llame a nuestra lnea principal al [phone] para una actualizacin sobre el estado de cualquier retraso o cierre.  Consejos para la medicacin en dermatologa: Por favor, guarde las cajas en las que vienen los medicamentos de uso tpico para ayudarle a seguir las instrucciones sobre dnde y cmo usarlos. Las farmacias generalmente imprimen las instrucciones del medicamento slo en las cajas y no directamente en los tubos del medicamento.   Si su medicamento es muy caro, por favor, pngase en contacto con nuestra oficina llamando al [phone] y presione la opcin 4 o envenos un mensaje a travs de MyChart.   No   podemos decirle cul ser su copago por los medicamentos por adelantado ya que esto es diferente dependiendo de la cobertura de su seguro. Sin embargo, es posible que podamos encontrar un medicamento sustituto a menor costo o llenar un formulario para que el seguro cubra el medicamento que se considera necesario.   Si se requiere una autorizacin previa para que su compaa de seguros cubra su medicamento, por favor permtanos de 1 a 2 das hbiles para completar este proceso.  Los precios de los medicamentos varan con frecuencia dependiendo del lugar de dnde se surte la receta y alguna farmacias pueden ofrecer precios ms baratos.  El sitio web www.goodrx.com tiene cupones para medicamentos de diferentes farmacias. Los precios aqu no tienen en cuenta lo que podra costar con la ayuda del seguro (puede ser ms barato con su seguro), pero el sitio web puede darle el precio si no utiliz ningn seguro.  - Puede imprimir el cupn correspondiente y llevarlo con su receta a la farmacia.  - Tambin puede pasar  por nuestra oficina durante el horario de atencin regular y recoger una tarjeta de cupones de GoodRx.  - Si necesita que su receta se enve electrnicamente a una farmacia diferente, informe a nuestra oficina a travs de MyChart de [facility] o por telfono llamando al [phone] y presione la opcin 4.  

## 2021-06-05 NOTE — Progress Notes (Signed)
Follow-Up Visit   Subjective  Gregory Stark is a 80 y.o. male who presents for the following: FBSE (Patient here for full body skin exam and skin cancer screening. Patient with a hx of BCC. Patient does have a few spots at right temple that have come up since last appt. ).   The following portions of the chart were reviewed this encounter and updated as appropriate:   Tobacco  Allergies  Meds  Problems  Med Hx  Surg Hx  Fam Hx      Review of Systems:  No other skin or systemic complaints except as noted in HPI or Assessment and Plan.  Objective  Well appearing patient in no apparent distress; mood and affect are within normal limits.  A full examination was performed including scalp, head, eyes, ears, nose, lips, neck, chest, axillae, abdomen, back, buttocks, bilateral upper extremities, bilateral lower extremities, hands, feet, fingers, toes, fingernails, and toenails. All findings within normal limits unless otherwise noted below.  Right Forearm x 1, right postauricular x 1, right angle of mandible x 1, right forehead x 1, left helix x 1, left preauricular x 1, left forearm x 2 (8) Erythematous thin papules/macules with gritty scale.   bilateral arms Depigmented macules   Assessment & Plan  AK (actinic keratosis) (8) Right Forearm x 1, right postauricular x 1, right angle of mandible x 1, right forehead x 1, left helix x 1, left preauricular x 1, left forearm x 2  Prior to procedure, discussed risks of blister formation, small wound, skin dyspigmentation, or rare scar following cryotherapy. Recommend Vaseline ointment to treated areas while healing.  Actinic keratoses are precancerous spots that appear secondary to cumulative UV radiation exposure/sun exposure over time. They are chronic with expected duration over 1 year. A portion of actinic keratoses will progress to squamous cell carcinoma of the skin. It is not possible to reliably predict which spots will progress to  skin cancer and so treatment is recommended to prevent development of skin cancer.  Recommend daily broad spectrum sunscreen SPF 30+ to sun-exposed areas, reapply every 2 hours as needed.  Recommend staying in the shade or wearing long sleeves, sun glasses (UVA+UVB protection) and wide brim hats (4-inch brim around the entire circumference of the hat). Call for new or changing lesions.   Destruction of lesion - Right Forearm x 1, right postauricular x 1, right angle of mandible x 1, right forehead x 1, left helix x 1, left preauricular x 1, left forearm x 2  Destruction method: cryotherapy   Informed consent: discussed and consent obtained   Lesion destroyed using liquid nitrogen: Yes   Cryotherapy cycles:  2 Outcome: patient tolerated procedure well with no complications   Post-procedure details: wound care instructions given    Vitiligo bilateral arms  Chronic condition with duration or expected duration over one year. Condition is bothersome to patient. Currently flared.  Reviewed chronic nature, no cure and can be difficult to treat.  Vitiligo is an autoimmune condition which causes loss of skin pigment and is commonly seen on the face and may also involve areas of trauma like hands, elbows, knees, and ankles.  Treatments include topical steroids and other topical anti-inflammatory ointments/creams and topical and oral Jak inhibitors.  Sometimes narrow band UV light therapy or Xtrac laser is helpful, both of which require twice weekly treatments for at least 3-6 months.  Antioxidant vitamins, such as Vitamins A,C,E,D, Folic Acid and B12 may be added to enhance treatment.  Patient having blood work with PCP later this month. Will give patient order to have thyroid testing added.  Discussed prescription therapy with opzelura or topical steroid or calcineurin inhibitor. Pt defers at this time.  Related Procedures TSH Rfx on Abnormal to Free T4  Lentigines - Scattered tan macules -  Due to sun exposure - Benign-appearing, observe - Recommend daily broad spectrum sunscreen SPF 30+ to sun-exposed areas, reapply every 2 hours as needed. - Call for any changes  Seborrheic Keratoses - Stuck-on, waxy, tan-brown papules and/or plaques  - Benign-appearing - Discussed benign etiology and prognosis. - Observe - Call for any changes  Melanocytic Nevi - Tan-brown and/or pink-flesh-colored symmetric macules and papules - Benign appearing on exam today - Observation - Call clinic for new or changing moles - Recommend daily use of broad spectrum spf 30+ sunscreen to sun-exposed areas.   Hemangiomas - Red papules - Discussed benign nature - Observe - Call for any changes  Actinic Damage - Chronic condition, secondary to cumulative UV/sun exposure - diffuse scaly erythematous macules with underlying dyspigmentation - Recommend daily broad spectrum sunscreen SPF 30+ to sun-exposed areas, reapply every 2 hours as needed.  - Staying in the shade or wearing long sleeves, sun glasses (UVA+UVB protection) and wide brim hats (4-inch brim around the entire circumference of the hat) are also recommended for sun protection.  - Call for new or changing lesions.  Skin cancer screening performed today.  History of Basal Cell Carcinoma of the Skin - No evidence of recurrence today - Recommend regular full body skin exams - Recommend daily broad spectrum sunscreen SPF 30+ to sun-exposed areas, reapply every 2 hours as needed.  - Call if any new or changing lesions are noted between office visits  Return for AK follow up, as scheduled, 1 year FBSE.  [name], RMA, am acting as scribe for Forest Gleason, MD .  Documentation: I have reviewed the above documentation for accuracy and completeness, and I agree with the above.  Forest Gleason, MD

## 2021-06-10 ENCOUNTER — Encounter: Payer: Self-pay | Admitting: Dermatology

## 2021-07-01 IMAGING — CT CT HEAD W/O CM
3 series · 15 of 46 positions shown, 18 images · non-contrast
Comparison: None.

CLINICAL DATA: Episodic double vision for the past 1.5 years

EXAM:
CT HEAD WITHOUT CONTRAST
TECHNIQUE: Contiguous axial images were obtained from the base of the skull
through the vertex without intravenous contrast.

[Series 2: head wo · axial · 0.47mm/px · z∈[-121,-1]mm · 9 of 29 slices shown, 12 images]
[im 3/29  brain]
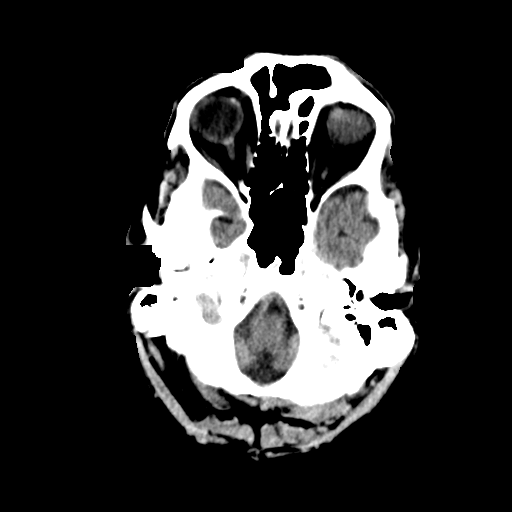
[im 3/29  bone]
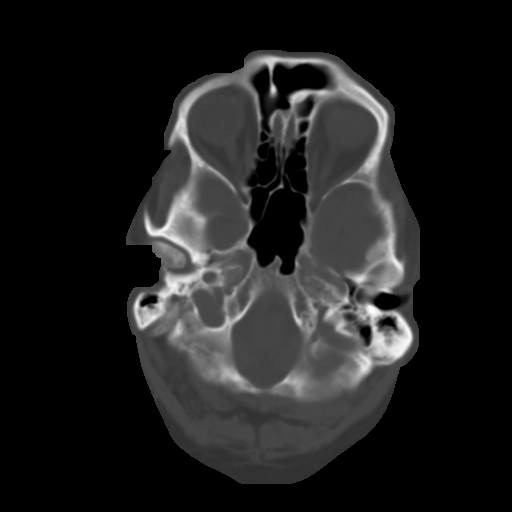
[im 6/29  brain]
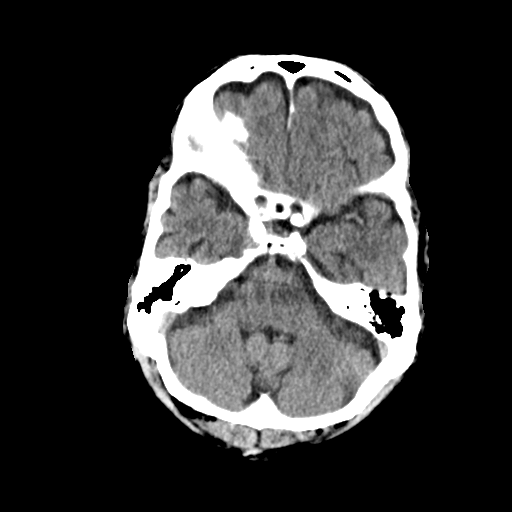
[im 9/29  brain]
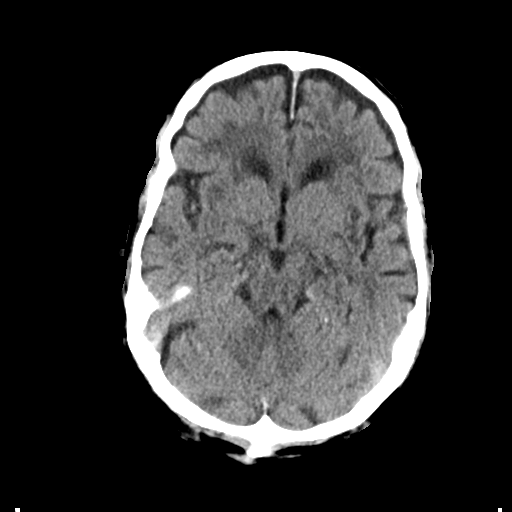
[im 12/29  brain]
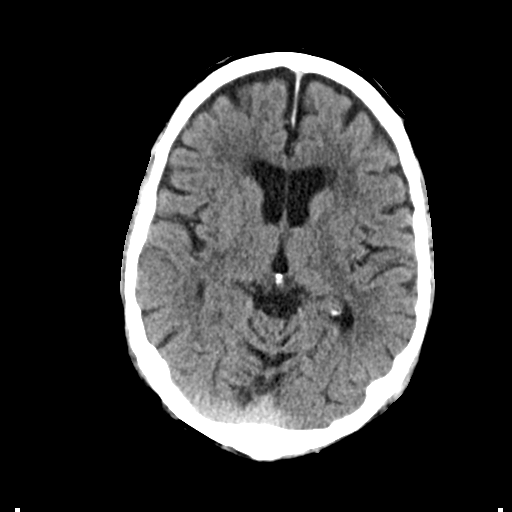
[im 15/29  brain]
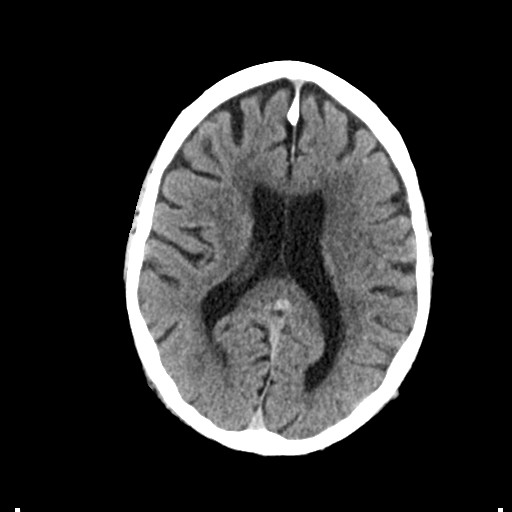
[im 15/29  bone]
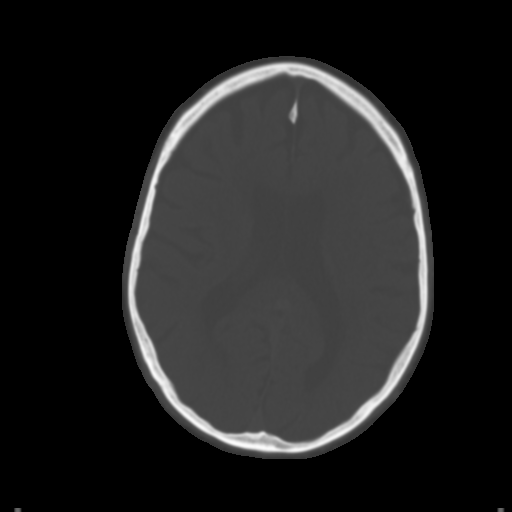
[im 18/29  brain]
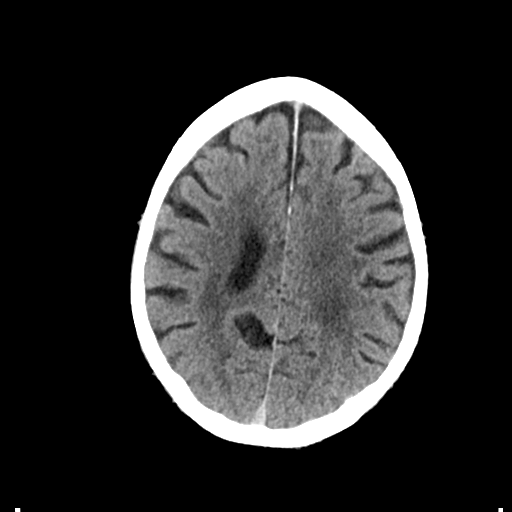
[im 21/29  brain]
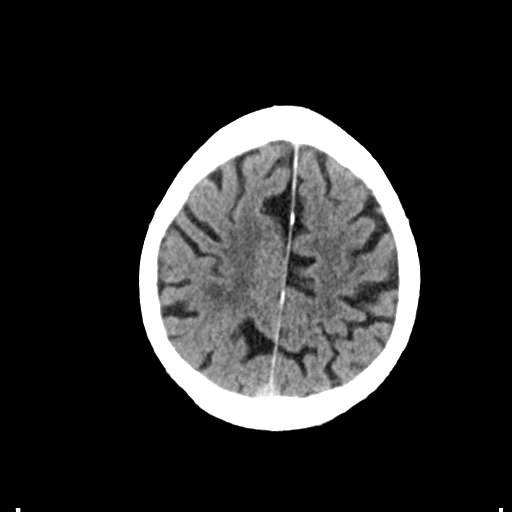
[im 24/29  brain]
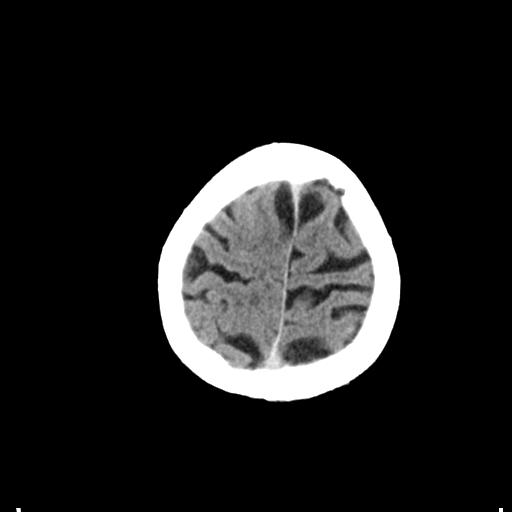
[im 27/29  brain]
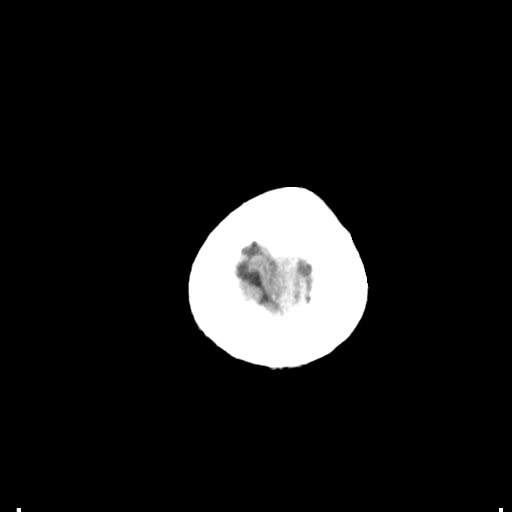
[im 27/29  bone]
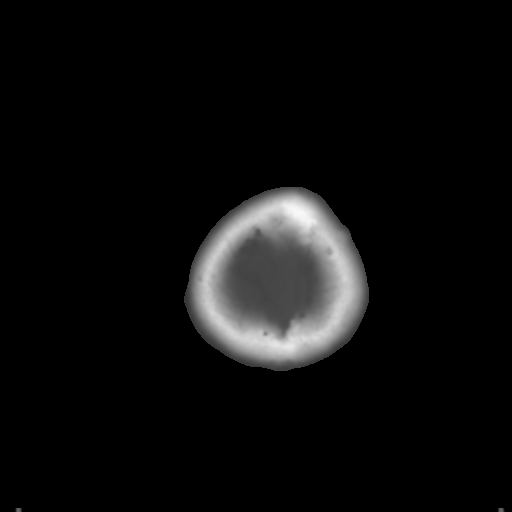

[Series 4: coronal soft tissue · coronal · 0.29mm/px · 3 of 67 slices shown]
[im 23/67  brain]
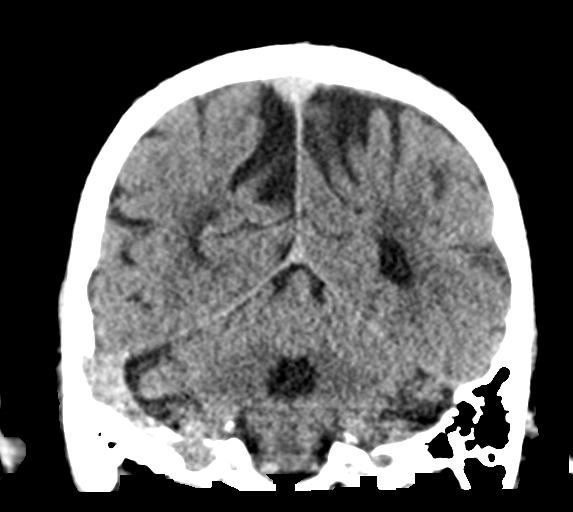
[im 30/67  brain]
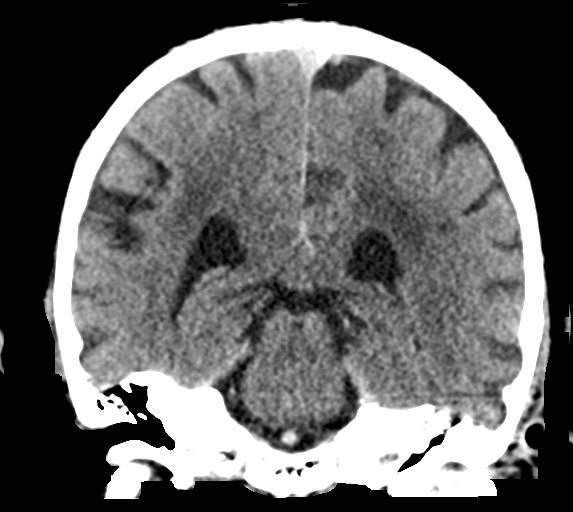
[im 37/67  brain]
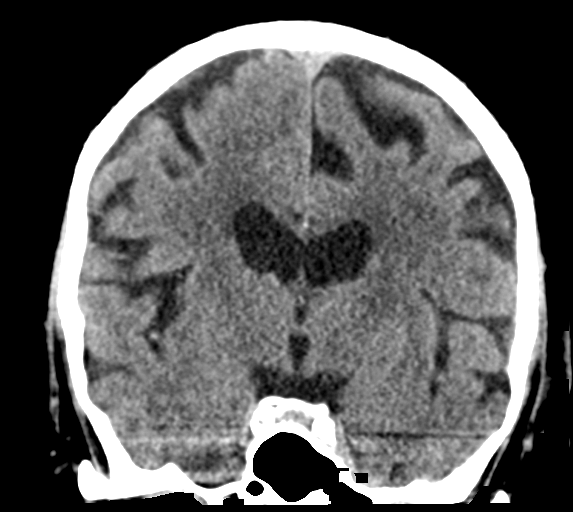

[Series 5: sagittal soft tissue · sagittal · 0.29mm/px · 3 of 57 slices shown]
[im 19/57  brain]
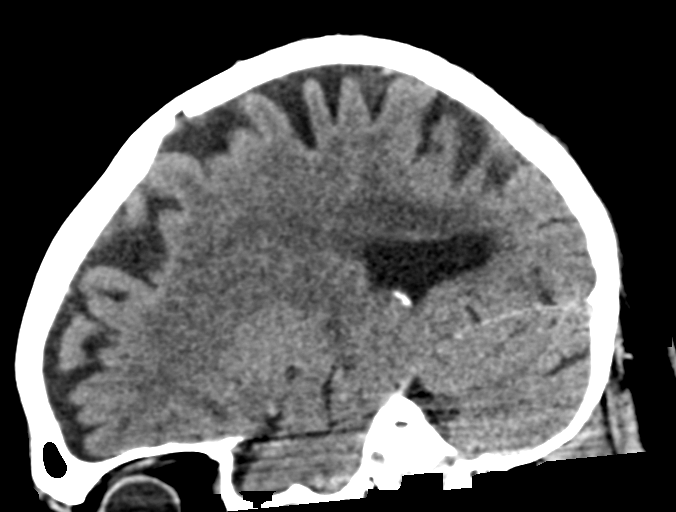
[im 29/57  brain]
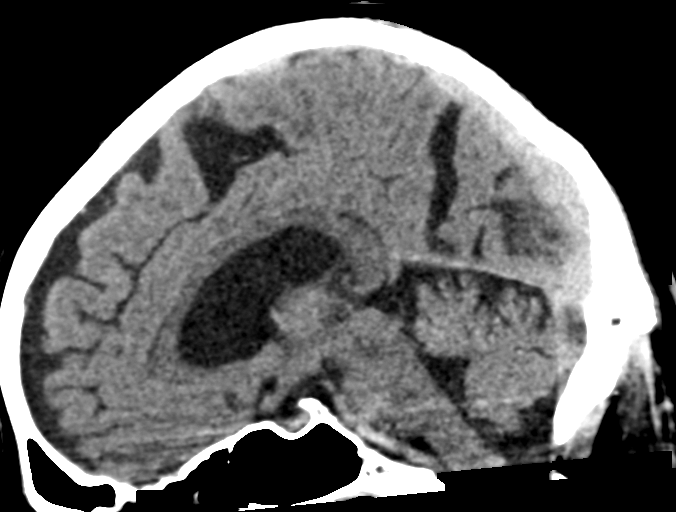
[im 38/57  brain]
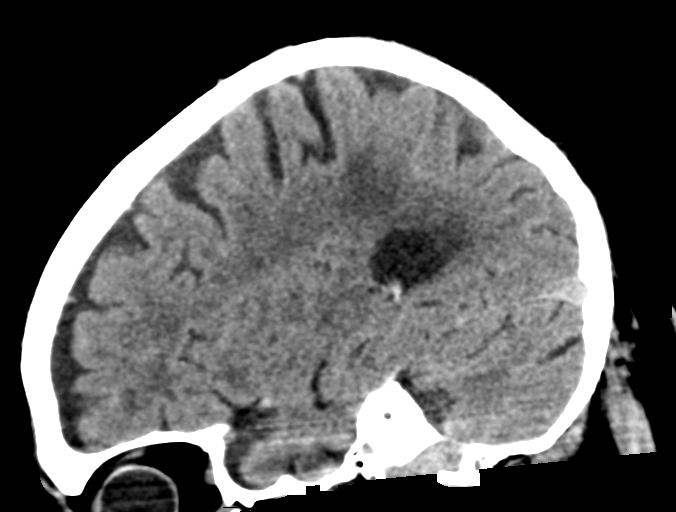

[15 of 46 positions shown; findings below may reference images not displayed]

FINDINGS: Brain: No evidence of acute infarction, hemorrhage, hydrocephalus,
extra-axial collection or mass lesion/mass effect. Symmetric
prominence of the ventricles, cisterns and sulci compatible with
parenchymal volume loss. Patchy areas of white matter
hypoattenuation are most compatible with chronic microvascular
angiopathy.

Vascular: Atherosclerotic calcification of the carotid siphons. No
hyperdense vessel.

Skull: No calvarial fracture or suspicious osseous lesion. No scalp
swelling or hematoma.

Sinuses/Orbits: Paranasal sinuses and mastoid air cells are
predominantly clear. Included orbital structures are unremarkable.

Other: None
IMPRESSION: 1. No acute intracranial abnormality.
2. Frontal lobe predominant parenchymal atrophy and chronic
microvascular angiopathy.

## 2021-08-28 ENCOUNTER — Ambulatory Visit: Payer: Medicare Other | Admitting: Dermatology

## 2021-09-18 ENCOUNTER — Telehealth: Payer: Self-pay

## 2021-09-18 ENCOUNTER — Other Ambulatory Visit: Payer: Self-pay

## 2021-09-18 NOTE — Progress Notes (Signed)
error 

## 2021-09-18 NOTE — Telephone Encounter (Signed)
Called patient regarding a staff message Dr. Laurence Ferrari had sent regarding patient's need for Thyroid labs checked regarding vitiligo. Patient would like to come in on Monday and pick up lab slip.  ? ?Lab orders placed and lab slip left at front desk awaiting patient pick up.  ? ?Patient is scheduled for follow up with Dr. Nicole Kindred on 10/08/2021 ? ? ?Copy of message from Dr. Laurence Ferrari.  ?Please advise patient that the thyroid testing was not done back in December with his PCP blood work. Would recommend having the thyroid test done given his vitiligo (as it can be seen together with thyroid disease). We can put a lab slip at the front desk for him if he wants to pick it up and go by one of the labs to have it done. Thank you!  ?

## 2021-09-24 LAB — THYROID PANEL WITH TSH
Free Thyroxine Index: 1.7 (ref 1.2–4.9)
T3 Uptake Ratio: 25 % (ref 24–39)
T4, Total: 6.9 ug/dL (ref 4.5–12.0)
TSH: 1.48 u[IU]/mL (ref 0.450–4.500)

## 2021-10-08 ENCOUNTER — Ambulatory Visit: Payer: Medicare Other | Admitting: Dermatology

## 2021-10-28 ENCOUNTER — Ambulatory Visit (INDEPENDENT_AMBULATORY_CARE_PROVIDER_SITE_OTHER): Payer: Medicare Other | Admitting: Dermatology

## 2021-10-28 DIAGNOSIS — L720 Epidermal cyst: Secondary | ICD-10-CM | POA: Diagnosis not present

## 2021-10-28 DIAGNOSIS — L219 Seborrheic dermatitis, unspecified: Secondary | ICD-10-CM

## 2021-10-28 DIAGNOSIS — L821 Other seborrheic keratosis: Secondary | ICD-10-CM

## 2021-10-28 DIAGNOSIS — L578 Other skin changes due to chronic exposure to nonionizing radiation: Secondary | ICD-10-CM

## 2021-10-28 DIAGNOSIS — Z85828 Personal history of other malignant neoplasm of skin: Secondary | ICD-10-CM

## 2021-10-28 DIAGNOSIS — L820 Inflamed seborrheic keratosis: Secondary | ICD-10-CM | POA: Diagnosis not present

## 2021-10-28 MED ORDER — HYDROCORTISONE 2.5 % EX CREA: TOPICAL_CREAM | CUTANEOUS | 2 refills | Status: DC

## 2021-10-28 MED ORDER — KETOCONAZOLE 2 % EX CREA: TOPICAL_CREAM | CUTANEOUS | 2 refills | Status: DC

## 2021-10-28 NOTE — Patient Instructions (Addendum)
Cryotherapy Aftercare ? ?Wash gently with soap and water everyday.   ?Apply Vaseline and Band-Aid daily until healed.  ? ? ? ?Seborrheic Dermatitis ? ?Mix hydrocortisone with ketaconazole 2% twice a day. If improved, decrease to hydrocortisone and ketaconazole mixed once a day. If still clear, decrease to ketaconazole only. ? ? ? ?Seborrheic Keratosis ? ?What causes seborrheic keratoses? ?Seborrheic keratoses are harmless, common skin growths that first appear during adult life.  As time goes by, more growths appear.  Some people may develop a large number of them.  Seborrheic keratoses appear on both covered and uncovered body parts.  They are not caused by sunlight.  The tendency to develop seborrheic keratoses can be inherited.  They vary in color from skin-colored to gray, brown, or even black.  They can be either smooth or have a rough, warty surface.   ?Seborrheic keratoses are superficial and look as if they were stuck on the skin.  Under the microscope this type of keratosis looks like layers upon layers of skin.  That is why at times the top layer may seem to fall off, but the rest of the growth remains and re-grows.   ? ?Treatment ?Seborrheic keratoses do not need to be treated, but can easily be removed in the office.  Seborrheic keratoses often cause symptoms when they rub on clothing or jewelry.  Lesions can be in the way of shaving.  If they become inflamed, they can cause itching, soreness, or burning.  Removal of a seborrheic keratosis can be accomplished by freezing, burning, or surgery. ?If any spot bleeds, scabs, or grows rapidly, please return to have it checked, as these can be an indication of a skin cancer. ? ?If You Need Anything After Your Visit ? ?If you have any questions or concerns for your doctor, please call our main line at [phone] and press option 4 to reach your doctor's medical assistant. If no one answers, please leave a voicemail as directed and we will return your call as  soon as possible. Messages left after 4 pm will be answered the following business day.  ? ?You may also send [place] a message via MyChart. We typically respond to MyChart messages within 1-2 business days. ? ?For prescription refills, please ask your pharmacy to contact our office. Our fax number is [phone]. ? ?If you have an urgent issue when the clinic is closed that cannot wait until the next business day, you can page your doctor at the number below.   ? ?Please note that while we do our best to be available for urgent issues outside of office hours, we are not available 24/7.  ? ?If you have an urgent issue and are unable to reach [place], you may choose to seek medical care at your doctor's office, retail clinic, urgent care center, or emergency room. ? ?If you have a medical emergency, please immediately call 911 or go to the emergency department. ? ?Pager Numbers ? ?- Dr. Nehemiah Massed: [phone] ? ?- Dr. Laurence Ferrari: [phone] ? ?- Dr. Nicole Kindred: [phone] ? ?In the event of inclement weather, please call our main line at [phone] for an update on the status of any delays or closures. ? ?Dermatology Medication Tips: ?Please keep the boxes that topical medications come in in order to help keep track of the instructions about where and how to use these. Pharmacies typically print the medication instructions only on the boxes and not directly on the medication tubes.  ? ?If your medication is too  expensive, please contact our office at [phone] option 4 or send [place] a message through [employer].  ? ?We are unable to tell what your co-pay for medications will be in advance as this is different depending on your insurance coverage. However, we may be able to find a substitute medication at lower cost or fill out paperwork to get insurance to cover a needed medication.  ? ?If a prior authorization is required to get your medication covered by your insurance company, please allow [place] 1-2 business days to complete this  process. ? ?Drug prices often vary depending on where the prescription is filled and some pharmacies may offer cheaper prices. ? ?The website www.goodrx.com contains coupons for medications through different pharmacies. The prices here do not account for what the cost may be with help from insurance (it may be cheaper with your insurance), but the website can give you the price if you did not use any insurance.  ?- You can print the associated coupon and take it with your prescription to the pharmacy.  ?- You may also stop by our office during regular business hours and pick up a GoodRx coupon card.  ?- If you need your prescription sent electronically to a different pharmacy, notify our office through [facility] or by phone at [phone] option 4. ? ? ? ? ?Si Usted Necesita Algo Despu?s de Su Visita ? ?Tambi?n puede enviarnos un mensaje a trav?s de MyChart. Por lo general respondemos a los mensajes de MyChart en el transcurso de 1 a 2 d?as h?biles. ? ?Para renovar recetas, por favor pida a su farmacia que se ponga en contacto con nuestra oficina. Nuestro n?mero de fax es el [phone]. ? ?Si tiene un asunto urgente cuando la cl?nica est? cerrada y que no puede esperar hasta el siguiente d?a h?bil, puede llamar/localizar a su doctor(a) al n?mero que aparece a continuaci?n.  ? ?Por favor, tenga en cuenta que aunque hacemos todo lo posible para estar disponibles para asuntos urgentes fuera del horario de oficina, no estamos disponibles las 24 horas del d?a, los 7 d?as de la semana.  ? ?Si tiene un problema urgente y no puede comunicarse con nosotros, puede optar por buscar atenci?n m?dica  en el consultorio de su doctor(a), en una cl?nica privada, en un centro de atenci?n urgente o en una sala de emergencias. ? ?Si tiene Engineer, maintenance (IT) m?dica, por favor llame inmediatamente al 911 o vaya a la sala de emergencias. ? ?N?meros de b?per ? ?- Dr. Nehemiah Massed: [phone] ? ?- Dra. Moye: [phone] ? ?- Dra.  Nicole Kindred: [phone] ? ?En caso de inclemencias del tiempo, por favor llame a nuestra l?nea principal al [phone] para una actualizaci?n sobre el estado de cualquier retraso o cierre. ? ?Consejos para la medicaci?n en dermatolog?a: ?Por favor, guarde las cajas en las que vienen los medicamentos de uso t?pico para ayudarle a seguir las instrucciones sobre d?nde y c?mo usarlos. Las farmacias generalmente imprimen las instrucciones del medicamento s?lo en las cajas y no directamente en los tubos del [place].  ? ?Si su medicamento es muy caro, por favor, p?ngase en contacto con Zigmund Daniel llamando al [phone] y presione la opci?n 4 o env?enos un mensaje a trav?s de MyChart.  ? ?No podemos decirle cu?l ser? su copago por los medicamentos por adelantado ya que esto es diferente dependiendo de la cobertura de su seguro. Sin embargo, es posible que podamos encontrar un medicamento sustituto a Electrical engineer un formulario para que el seguro  cubra el medicamento que se considera necesario.  ? ?Si se requiere Ardelia Mems autorizaci?n previa para que su compa??a de seguros [place] su medicamento, por favor perm?tanos de 1 a 2 d?as h?biles para completar este proceso. ? ?Los precios de los medicamentos var?an con frecuencia dependiendo del Environmental consultant de d?nde se surte la receta y alguna farmacias pueden ofrecer precios m?s baratos. ? ?El sitio web www.goodrx.com tiene cupones para medicamentos de Airline pilot. Los precios aqu? no tienen en cuenta lo que podr?a costar con la ayuda del seguro (puede ser m?s barato con su seguro), pero el sitio web puede darle el precio si no utiliz? ning?n seguro.  ?- Puede imprimir el cup?n correspondiente y llevarlo con su receta a la farmacia.  ?- Tambi?n puede pasar por nuestra oficina durante el horario de atenci?n regular y recoger una tarjeta de cupones de GoodRx.  ?- Si necesita que su receta se env?e electr?nicamente a Chiropodist, informe a nuestra oficina a  trav?s de MyChart de [place] o por tel?fono llamando al [phone] y presione la opci?n 4. ? ?

## 2021-10-28 NOTE — Progress Notes (Signed)
? ?Follow-Up Visit ?  ?Subjective  ?Gregory Stark is a 81 y.o. male who presents for the following: Follow-up. ? ?Patient here for follow-up AKs. He has a spot on his left face he would like checked. He uses hydrocortisone 2.5% cream and Xolegel samples to spot treat scaly areas on face. History of BCC of the right chest.  ? ?The following portions of the chart were reviewed this encounter and updated as appropriate:  ?  ?  ? ?Review of Systems:  No other skin or systemic complaints except as noted in HPI or Assessment and Plan. ? ?Objective  ?Well appearing patient in no apparent distress; mood and affect are within normal limits. ? ?A focused examination was performed including face, arms. Relevant physical exam findings are noted in the Assessment and Plan. ? ?Left Zygoma ?5.0 mm firm white papule ? ?Left preauricular ?Pink scaly papule ? ?face ?Pink patches with greasy scale.  ? ? ? ?Assessment & Plan  ?Actinic Damage ?- chronic, secondary to cumulative UV radiation exposure/sun exposure over time ?- diffuse scaly erythematous macules with underlying dyspigmentation ?- Recommend daily broad spectrum sunscreen SPF 30+ to sun-exposed areas, reapply every 2 hours as needed.  ?- Recommend staying in the shade or wearing long sleeves, sun glasses (UVA+UVB protection) and wide brim hats (4-inch brim around the entire circumference of the hat). ?- Call for new or changing lesions. ? ?Seborrheic Keratoses ?- Stuck-on, waxy, tan-brown papules and/or plaques  ?- Benign-appearing ?- Discussed benign etiology and prognosis. ?- Observe ?- Call for any changes ? ?History of Basal Cell Carcinoma of the Skin ?- No evidence of recurrence today of the right chest ?- Recommend regular full body skin exams ?- Recommend daily broad spectrum sunscreen SPF 30+ to sun-exposed areas, reapply every 2 hours as needed.  ?- Call if any new or changing lesions are noted between office visits ? ? ?Epidermal cyst ?Left  Zygoma ? ?Benign-appearing. Exam most consistent with an epidermal inclusion cyst. Discussed that a cyst is a benign growth that can grow over time and sometimes get irritated or inflamed. Recommend observation if it is not bothersome. Discussed option of surgical excision to remove it if it is growing, symptomatic, or other changes noted. Please call for new or changing lesions so they can be evaluated. ? ? ? ?Inflamed seborrheic keratosis ?Left preauricular ? ?Destruction of lesion - Left preauricular ? ?Destruction method: cryotherapy   ?Informed consent: discussed and consent obtained   ?Lesion destroyed using liquid nitrogen: Yes   ?Region frozen until ice ball extended beyond lesion: Yes   ?Outcome: patient tolerated procedure well with no complications   ?Post-procedure details: wound care instructions given   ?Additional details:  Prior to procedure, discussed risks of blister formation, small wound, skin dyspigmentation, or rare scar following cryotherapy. Recommend Vaseline ointment to treated areas while healing. ? ? 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 Currently well-controlled.  ? ?Seborrheic Dermatitis  ?-  is a chronic persistent rash characterized by pinkness and scaling most commonly of the mid face but also can occur on the scalp (dandruff), ears; mid chest, mid back and groin.  It tends to be exacerbated by stress and cooler weather.  People who have neurologic disease may experience new onset or exacerbation of existing seborrheic dermatitis.  The condition is not curable but treatable and can be controlled. ? ?Continue hydrocortisone 2.5% cream Apply qd/bid AA face as directed 2Rf (Rx on hold) ? ?  Start ketoconazole 2% cream Apply qd/bid AA face as directed 2Rf. (In place of Xologel) ? ?ketoconazole (NIZORAL) 2 % cream - face ?Apply to pink, scaly areas on face once to twice daily as directed. ? ?hydrocortisone 2.5 % cream -  face ?Apply to pink, scaly areas on face once to twice daily as directed. ? ? ?Return as scheduled with Dr Laurence Ferrari. ? ?I, Jamesetta Orleans, CMA, am acting as scribe for Brendolyn Patty, MD . ? ?Documentation: I have reviewed the above documentation for accuracy and completeness, and I agree with the above. ? ?Brendolyn Patty MD  ? ?

## 2021-12-23 ENCOUNTER — Encounter: Payer: Self-pay | Admitting: Dermatology

## 2022-04-01 ENCOUNTER — Other Ambulatory Visit (INDEPENDENT_AMBULATORY_CARE_PROVIDER_SITE_OTHER): Payer: Self-pay | Admitting: Vascular Surgery

## 2022-04-01 DIAGNOSIS — I6523 Occlusion and stenosis of bilateral carotid arteries: Secondary | ICD-10-CM

## 2022-04-02 ENCOUNTER — Ambulatory Visit (INDEPENDENT_AMBULATORY_CARE_PROVIDER_SITE_OTHER): Payer: Medicare Other | Admitting: Vascular Surgery

## 2022-04-02 ENCOUNTER — Ambulatory Visit (INDEPENDENT_AMBULATORY_CARE_PROVIDER_SITE_OTHER): Payer: Medicare Other

## 2022-04-02 DIAGNOSIS — I6523 Occlusion and stenosis of bilateral carotid arteries: Secondary | ICD-10-CM | POA: Diagnosis not present

## 2022-04-16 ENCOUNTER — Ambulatory Visit (INDEPENDENT_AMBULATORY_CARE_PROVIDER_SITE_OTHER): Payer: Medicare Other | Admitting: Vascular Surgery

## 2022-05-04 ENCOUNTER — Encounter (INDEPENDENT_AMBULATORY_CARE_PROVIDER_SITE_OTHER): Payer: Self-pay

## 2022-06-10 ENCOUNTER — Ambulatory Visit: Payer: Medicare Other | Admitting: Dermatology

## 2022-07-13 NOTE — Progress Notes (Signed)
MRN : [id number]  Gregory Stark is a 82 y.o. (02-17-1941) male who presents with chief complaint of check carotid arteries.  History of Present Illness:   The patient is seen for follow up evaluation of carotid stenosis status post right carotid endarterectomy on 08/09/2019.  Previously,  the patient described a sharp but brief pain over the right mastoid, behind the ear when he begins eating.  It resolves with chewing.  He noted this is  a little better but certainly not resolved.  He also notes his tongue is deviating to the right and has had speech therapy to help with this.  He states he is having excessive salivation when he see food. These symptoms have improved since his previous visit.   The patient denies interval amaurosis fugax. There is no recent history of TIA symptoms or focal motor deficits. There is no prior documented CVA.   The patient denies headache.   The patient is taking enteric-coated aspirin 81 mg daily.   The patient has a history of coronary artery disease, no recent episodes of angina or shortness of breath. The patient denies PAD or claudication symptoms. There is a history of hyperlipidemia which is being treated with a statin.    Carotid duplex shows widely patent right ICA and <00% LICA stenosis.  This is unchanged compared to the previous ultrasound  Current Meds  Medication Sig   hydrocortisone 2.5 % cream Apply to pink, scaly areas on face once to twice daily as directed.   ketoconazole (NIZORAL) 2 % cream Apply to pink, scaly areas on face once to twice daily as directed.   lisinopril (ZESTRIL) 10 MG tablet Take 10 mg by mouth at bedtime.    pravastatin (PRAVACHOL) 40 MG tablet Take 40 mg by mouth at bedtime.    Vitamin D, Ergocalciferol, (DRISDOL) 1.25 MG (50000 UT) CAPS capsule Take 50,000 Units by mouth every 7 (seven) days.    Past Medical History:  Diagnosis Date   Actinic keratosis    Basal cell carcinoma 03/23/2019   Right chest.  Superficial and nodular patterns. Tx: EDC   Cancer ([place])    chest   Gastroesophageal reflux disease    Hyperlipidemia    Hypertension    Peripheral vascular disease ([place])     Past Surgical History:  Procedure Laterality Date   arthroscoppy knee Left    CATARACT EXTRACTION, BILATERAL     ENDARTERECTOMY Right 08/09/2019   Procedure: ENDARTERECTOMY CAROTID;  Surgeon: Katha Cabal, MD;  Location: [facility] ORS;  Service: Vascular;  Laterality: Right;    Social History Social History   Tobacco Use   Smoking status: Never   Smokeless tobacco: Never  Vaping Use   Vaping Use: Never used  Substance Use Topics   Alcohol use: Yes    Alcohol/week: 4.0 standard drinks of alcohol    Types: 3 Shots of liquor, 1 Glasses of wine per week    Comment: socially   Drug use: Never    Family History Family History  Problem Relation Age of Onset   Breast cancer Mother    Macular degeneration Mother    Colon cancer Father    Melanoma Sister    Lupus Sister     No Known Allergies   REVIEW OF SYSTEMS (Negative unless checked)  Constitutional: '[]'$ Weight loss  '[]'$ Fever  '[]'$ Chills Cardiac: '[]'$ Chest pain   '[]'$ Chest pressure   '[]'$ Palpitations   '[]'$ Shortness of breath when laying flat   '[]'$ Shortness of breath  with exertion. Vascular:  '[x]'$ Pain in legs with walking   '[]'$ Pain in legs at rest  '[]'$ History of DVT   '[]'$ Phlebitis   '[]'$ Swelling in legs   '[]'$ Varicose veins   '[]'$ Non-healing ulcers Pulmonary:   '[]'$ Uses home oxygen   '[]'$ Productive cough   '[]'$ Hemoptysis   '[]'$ Wheeze  '[]'$ COPD   '[]'$ Asthma Neurologic:  '[]'$ Dizziness   '[]'$ Seizures   '[]'$ History of stroke   '[]'$ History of TIA  '[]'$ Aphasia   '[]'$ Vissual changes   '[]'$ Weakness or numbness in arm   '[]'$ Weakness or numbness in leg Musculoskeletal:   '[]'$ Joint swelling   '[]'$ Joint pain   '[]'$ Low back pain Hematologic:  '[]'$ Easy bruising  '[]'$ Easy bleeding   '[]'$ Hypercoagulable state   '[]'$ Anemic Gastrointestinal:  '[]'$ Diarrhea   '[]'$ Vomiting  '[x]'$ Gastroesophageal reflux/heartburn   '[]'$ Difficulty  swallowing. Genitourinary:  '[]'$ Chronic kidney disease   '[]'$ Difficult urination  '[]'$ Frequent urination   '[]'$ Blood in urine Skin:  '[]'$ Rashes   '[]'$ Ulcers  Psychological:  '[]'$ History of anxiety   '[]'$  History of major depression.  Physical Examination  Vitals:   07/16/22 1315  BP: (!) 163/102  Pulse: (!) 102  Weight: 205 lb (93 kg)  Height: '5\' 8"'$  (1.727 m)   Body mass index is 31.17 kg/m. Gen: WD/WN, NAD Head: [place]/AT, No temporalis wasting.  Ear/Nose/Throat: Hearing grossly intact, nares w/o erythema or drainage Eyes: PER, EOMI, sclera nonicteric.  Neck: Supple, no masses.  No bruit or JVD.  Pulmonary:  Good air movement, no audible wheezing, no use of accessory muscles.  Cardiac: RRR, normal S1, S2, no Murmurs. Vascular:  carotid bruit noted Vessel Right Left  Radial Palpable Palpable  Carotid  Palpable  Palpable  Subclav  Palpable Palpable  Gastrointestinal: soft, non-distended. No guarding/no peritoneal signs.  Musculoskeletal: M/S 5/5 throughout.  No visible deformity.  Neurologic: CN 2-12 intact. Pain and light touch intact in extremities.  Symmetrical.  Speech is fluent. Motor exam as listed above. Psychiatric: Judgment intact, Mood & affect appropriate for pt's clinical situation. Dermatologic: No rashes or ulcers noted.  No changes consistent with cellulitis.   CBC Lab Results  Component Value Date   WBC 19.3 (H) 08/10/2019   HGB 15.7 08/10/2019   HCT 46.6 08/10/2019   MCV 94.1 08/10/2019   PLT 201 08/10/2019    BMET    Component Value Date/Time   NA 140 08/10/2019 0340   K 5.3 (H) 08/10/2019 0340   CL 105 08/10/2019 0340   CO2 29 08/10/2019 0340   GLUCOSE 147 (H) 08/10/2019 0340   BUN 16 08/10/2019 0340   CREATININE 1.24 08/10/2019 0340   CALCIUM 9.2 08/10/2019 0340   GFRNONAA 55 (L) 08/10/2019 0340   GFRAA >60 08/10/2019 0340   CrCl cannot be calculated (Patient's most recent lab result is older than the maximum 21 days allowed.).  COAG Lab Results   Component Value Date   INR 1.0 08/08/2019    Radiology No results found.   Assessment/Plan 1. Bilateral carotid artery stenosis Recommend:  Given the patient's asymptomatic subcritical stenosis no further invasive testing or surgery at this time.  Duplex ultrasound shows <40% stenosis bilaterally.  Continue antiplatelet therapy as prescribed Continue management of CAD, HTN and Hyperlipidemia Healthy heart diet,  encouraged exercise at least 4 times per week Follow up in 12 months with duplex ultrasound and physical exam   2. HTN, goal below 140/80 Continue antihypertensive medications as already ordered, these medications have been reviewed and there are no changes at this time.  3. Mixed hyperlipidemia Continue statin as ordered and  reviewed, no changes at this time  4. Gastroesophageal reflux disease without esophagitis Continue PPI as already ordered, this medication has been reviewed and there are no changes at this time.  Avoidence of caffeine and alcohol  Moderate elevation of the head of the bed     Kris Hartmann, NP  08/02/2022 4:34 PM

## 2022-07-16 ENCOUNTER — Encounter (INDEPENDENT_AMBULATORY_CARE_PROVIDER_SITE_OTHER): Payer: Self-pay | Admitting: Nurse Practitioner

## 2022-07-16 ENCOUNTER — Ambulatory Visit (INDEPENDENT_AMBULATORY_CARE_PROVIDER_SITE_OTHER): Payer: Medicare Other | Admitting: Nurse Practitioner

## 2022-07-16 VITALS — BP 163/102 | HR 102 | Ht 68.0 in | Wt 205.0 lb

## 2022-07-16 DIAGNOSIS — K219 Gastro-esophageal reflux disease without esophagitis: Secondary | ICD-10-CM | POA: Diagnosis not present

## 2022-07-16 DIAGNOSIS — E782 Mixed hyperlipidemia: Secondary | ICD-10-CM

## 2022-07-16 DIAGNOSIS — I10 Essential (primary) hypertension: Secondary | ICD-10-CM

## 2022-07-16 DIAGNOSIS — I6523 Occlusion and stenosis of bilateral carotid arteries: Secondary | ICD-10-CM | POA: Diagnosis not present

## 2022-08-02 ENCOUNTER — Encounter (INDEPENDENT_AMBULATORY_CARE_PROVIDER_SITE_OTHER): Payer: Self-pay | Admitting: Nurse Practitioner

## 2022-09-23 ENCOUNTER — Ambulatory Visit (INDEPENDENT_AMBULATORY_CARE_PROVIDER_SITE_OTHER): Payer: Medicare Other | Admitting: Dermatology

## 2022-09-23 ENCOUNTER — Encounter: Payer: Self-pay | Admitting: Dermatology

## 2022-09-23 VITALS — BP 152/89 | HR 61

## 2022-09-23 DIAGNOSIS — Z85828 Personal history of other malignant neoplasm of skin: Secondary | ICD-10-CM | POA: Diagnosis not present

## 2022-09-23 DIAGNOSIS — L814 Other melanin hyperpigmentation: Secondary | ICD-10-CM

## 2022-09-23 DIAGNOSIS — L821 Other seborrheic keratosis: Secondary | ICD-10-CM

## 2022-09-23 DIAGNOSIS — D229 Melanocytic nevi, unspecified: Secondary | ICD-10-CM

## 2022-09-23 DIAGNOSIS — L570 Actinic keratosis: Secondary | ICD-10-CM

## 2022-09-23 DIAGNOSIS — Z1283 Encounter for screening for malignant neoplasm of skin: Secondary | ICD-10-CM

## 2022-09-23 DIAGNOSIS — L219 Seborrheic dermatitis, unspecified: Secondary | ICD-10-CM | POA: Diagnosis not present

## 2022-09-23 DIAGNOSIS — L578 Other skin changes due to chronic exposure to nonionizing radiation: Secondary | ICD-10-CM

## 2022-09-23 MED ORDER — KETOCONAZOLE 2 % EX SHAM: MEDICATED_SHAMPOO | CUTANEOUS | 5 refills | Status: DC

## 2022-09-23 NOTE — Progress Notes (Signed)
Follow-Up Visit   Subjective  Gregory Stark is a 82 y.o. male who presents for the following: Skin Cancer Screening and Full Body Skin Exam - Hx BCC, Aks, patient does have spots on his face and behind the ears that he would like checked today.  The patient presents for Total-Body Skin Exam (TBSE) for skin cancer screening and mole check. The patient has spots, moles and lesions to be evaluated, some may be new or changing and the patient has concerns that these could be cancer.   The following portions of the chart were reviewed this encounter and updated as appropriate: medications, allergies, medical history  Review of Systems:  No other skin or systemic complaints except as noted in HPI or Assessment and Plan.  Objective  Well appearing patient in no apparent distress; mood and affect are within normal limits.  A full examination was performed including scalp, head, eyes, ears, nose, lips, neck, chest, axillae, abdomen, back, buttocks, bilateral upper extremities, bilateral lower extremities, hands, feet, fingers, toes, fingernails, and toenails. All findings within normal limits unless otherwise noted below.    Face Pink patches with greasy scale.     Assessment & Plan   Seborrheic dermatitis Face  Seborrheic Dermatitis  -  is a chronic persistent rash characterized by pinkness and scaling most commonly of the mid face but also can occur on the scalp (dandruff), ears; mid chest, mid back and groin.  It tends to be exacerbated by stress and cooler weather.  People who have neurologic disease may experience new onset or exacerbation of existing seborrheic dermatitis.  The condition is not curable but treatable and can be controlled.  Apply ketoconazole shampoo apply three times per week, massage into scalp and leave in for 10 minutes before rinsing out. This can be followed by conditioner or shampoo and conditioner of your choice.    SEBORRHEIC DERMATITIS Exam: Pink  patches with greasy scale at scalp  Chronic and persistent condition with duration or expected duration over one year. Condition is bothersome/symptomatic for patient. Currently flared.  Seborrheic Dermatitis  -  is a chronic persistent rash characterized by pinkness and scaling most commonly of the mid face but also can occur on the scalp (dandruff), ears; mid chest, mid back and groin.  It tends to be exacerbated by stress and cooler weather.  People who have neurologic disease may experience new onset or exacerbation of existing seborrheic dermatitis.  The condition is not curable but treatable and can be controlled.  Treatment Plan: Apply ketoconazole shampoo apply three times per week, massage into scalp and leave in for 10 minutes before rinsing out. This can be followed by conditioner or shampoo and conditioner of your choice.  Lentigines, Seborrheic Keratoses, Hemangiomas - Benign normal skin lesions - Benign-appearing - Call for any changes  Melanocytic Nevi - Tan-brown and/or pink-flesh-colored symmetric macules and papules - Benign appearing on exam today - Observation - Call clinic for new or changing moles - Recommend daily use of broad spectrum spf 30+ sunscreen to sun-exposed areas.   Actinic Damage - Chronic condition, secondary to cumulative UV/sun exposure - diffuse scaly erythematous macules with underlying dyspigmentation - Recommend daily broad spectrum sunscreen SPF 30+ to sun-exposed areas, reapply every 2 hours as needed.  - Staying in the shade or wearing long sleeves, sun glasses (UVA+UVB protection) and wide brim hats (4-inch brim around the entire circumference of the hat) are also recommended for sun protection.  - Call for new or changing  lesions.  History of Basal Cell Carcinoma of the Skin - No evidence of recurrence today - Recommend regular full body skin exams - Recommend daily broad spectrum sunscreen SPF 30+ to sun-exposed areas, reapply every 2  hours as needed.  - Call if any new or changing lesions are noted between office visits  ACTINIC KERATOSIS  Actinic keratoses are precancerous spots that appear secondary to cumulative UV radiation exposure/sun exposure over time. They are chronic with expected duration over 1 year. A portion of actinic keratoses will progress to squamous cell carcinoma of the skin. It is not possible to reliably predict which spots will progress to skin cancer and so treatment is recommended to prevent development of skin cancer.  Recommend daily broad spectrum sunscreen SPF 30+ to sun-exposed areas, reapply every 2 hours as needed.  Recommend staying in the shade or wearing long sleeves, sun glasses (UVA+UVB protection) and wide brim hats (4-inch brim around the entire circumference of the hat). Call for new or changing lesions.  Prior to procedure, discussed risks of blister formation, small wound, skin dyspigmentation, or rare scar following cryotherapy. Recommend Vaseline ointment to treated areas while healing.  Destruction Procedure Note Destruction method: cryotherapy   Informed consent: discussed and consent obtained   Lesion destroyed using liquid nitrogen: Yes   Cryotherapy cycles:  2 Outcome: patient tolerated procedure well with no complications   Post-procedure details: wound care instructions given   Locations: R vertex scalp x 1, L calf x 1, L lower lip x 1 # of Lesions Treated: 3  Skin cancer screening performed today.  Return in about 3 months (around 12/24/2022) for AK follow up .  Luther Redo, CMA, am acting as scribe for Forest Gleason, MD .   Documentation: I have reviewed the above documentation for accuracy and completeness, and I agree with the above.  Forest Gleason, MD

## 2022-09-23 NOTE — Patient Instructions (Signed)
Due to recent changes in healthcare laws, you may see results of your pathology and/or laboratory studies on MyChart before the doctors have had a chance to review them. We understand that in some cases there may be results that are confusing or concerning to you. Please understand that not all results are received at the same time and often the doctors may need to interpret multiple results in order to provide you with the best plan of care or course of treatment. Therefore, we ask that you please give us 2 business days to thoroughly review all your results before contacting the office for clarification. Should we see a critical lab result, you will be contacted sooner.   If You Need Anything After Your Visit  If you have any questions or concerns for your doctor, please call our main line at [phone] and press option 4 to reach your doctor's medical assistant. If no one answers, please leave a voicemail as directed and we will return your call as soon as possible. Messages left after 4 pm will be answered the following business day.   You may also send us a message via MyChart. We typically respond to MyChart messages within 1-2 business days.  For prescription refills, please ask your pharmacy to contact our office. Our fax number is [phone].  If you have an urgent issue when the clinic is closed that cannot wait until the next business day, you can page your doctor at the number below.    Please note that while we do our best to be available for urgent issues outside of office hours, we are not available 24/7.   If you have an urgent issue and are unable to reach us, you may choose to seek medical care at your doctor's office, retail clinic, urgent care center, or emergency room.  If you have a medical emergency, please immediately call 911 or go to the emergency department.  Pager Numbers  - Dr. Kowalski: [phone]  - Dr. Moye: [phone]  - Dr. Stewart:  [phone]  In the event of inclement weather, please call our main line at [phone] for an update on the status of any delays or closures.  Dermatology Medication Tips: Please keep the boxes that topical medications come in in order to help keep track of the instructions about where and how to use these. Pharmacies typically print the medication instructions only on the boxes and not directly on the medication tubes.   If your medication is too expensive, please contact our office at [phone] option 4 or send us a message through MyChart.   We are unable to tell what your co-pay for medications will be in advance as this is different depending on your insurance coverage. However, we may be able to find a substitute medication at lower cost or fill out paperwork to get insurance to cover a needed medication.   If a prior authorization is required to get your medication covered by your insurance company, please allow us 1-2 business days to complete this process.  Drug prices often vary depending on where the prescription is filled and some pharmacies may offer cheaper prices.  The website www.goodrx.com contains coupons for medications through different pharmacies. The prices here do not account for what the cost may be with help from insurance (it may be cheaper with your insurance), but the website can give you the price if you did not use any insurance.  - You can print the associated coupon and take it with   your prescription to the pharmacy.  - You may also stop by our office during regular business hours and pick up a GoodRx coupon card.  - If you need your prescription sent electronically to a different pharmacy, notify our office through [facility] MyChart or by phone at [phone] option 4.     Si Usted Necesita Algo Despus de Su Visita  Tambin puede enviarnos un mensaje a travs de MyChart. Por lo general respondemos a los mensajes de MyChart en el transcurso de 1 a 2  das hbiles.  Para renovar recetas, por favor pida a su farmacia que se ponga en contacto con nuestra oficina. Nuestro nmero de fax es el [phone].  Si tiene un asunto urgente cuando la clnica est cerrada y que no puede esperar hasta el siguiente da hbil, puede llamar/localizar a su doctor(a) al nmero que aparece a continuacin.   Por favor, tenga en cuenta que aunque hacemos todo lo posible para estar disponibles para asuntos urgentes fuera del horario de oficina, no estamos disponibles las 24 horas del da, los 7 das de la semana.   Si tiene un problema urgente y no puede comunicarse con nosotros, puede optar por buscar atencin mdica  en el consultorio de su doctor(a), en una clnica privada, en un centro de atencin urgente o en una sala de emergencias.  Si tiene una emergencia mdica, por favor llame inmediatamente al 911 o vaya a la sala de emergencias.  Nmeros de bper  - Dr. Kowalski: [phone]  - Dra. Moye: [phone]  - Dra. Stewart: [phone]  En caso de inclemencias del tiempo, por favor llame a nuestra lnea principal al [phone] para una actualizacin sobre el estado de cualquier retraso o cierre.  Consejos para la medicacin en dermatologa: Por favor, guarde las cajas en las que vienen los medicamentos de uso tpico para ayudarle a seguir las instrucciones sobre dnde y cmo usarlos. Las farmacias generalmente imprimen las instrucciones del medicamento slo en las cajas y no directamente en los tubos del medicamento.   Si su medicamento es muy caro, por favor, pngase en contacto con nuestra oficina llamando al [phone] y presione la opcin 4 o envenos un mensaje a travs de MyChart.   No podemos decirle cul ser su copago por los medicamentos por adelantado ya que esto es diferente dependiendo de la cobertura de su seguro. Sin embargo, es posible que podamos encontrar un medicamento sustituto a menor costo o llenar un formulario para que el  seguro cubra el medicamento que se considera necesario.   Si se requiere una autorizacin previa para que su compaa de seguros cubra su medicamento, por favor permtanos de 1 a 2 das hbiles para completar este proceso.  Los precios de los medicamentos varan con frecuencia dependiendo del lugar de dnde se surte la receta y alguna farmacias pueden ofrecer precios ms baratos.  El sitio web www.goodrx.com tiene cupones para medicamentos de diferentes farmacias. Los precios aqu no tienen en cuenta lo que podra costar con la ayuda del seguro (puede ser ms barato con su seguro), pero el sitio web puede darle el precio si no utiliz ningn seguro.  - Puede imprimir el cupn correspondiente y llevarlo con su receta a la farmacia.  - Tambin puede pasar por nuestra oficina durante el horario de atencin regular y recoger una tarjeta de cupones de GoodRx.  - Si necesita que su receta se enve electrnicamente a una farmacia diferente, informe a nuestra oficina a travs de MyChart de [facility]   o por telfono llamando al [phone] y presione la opcin 4.  

## 2022-12-31 ENCOUNTER — Ambulatory Visit: Payer: Medicare Other | Admitting: Dermatology

## 2023-03-16 ENCOUNTER — Ambulatory Visit: Payer: Medicare Other | Admitting: Dermatology

## 2023-03-16 ENCOUNTER — Encounter: Payer: Self-pay | Admitting: Dermatology

## 2023-03-16 ENCOUNTER — Ambulatory Visit (INDEPENDENT_AMBULATORY_CARE_PROVIDER_SITE_OTHER): Payer: Medicare Other | Admitting: Dermatology

## 2023-03-16 DIAGNOSIS — L219 Seborrheic dermatitis, unspecified: Secondary | ICD-10-CM

## 2023-03-16 DIAGNOSIS — C44229 Squamous cell carcinoma of skin of left ear and external auricular canal: Secondary | ICD-10-CM | POA: Diagnosis not present

## 2023-03-16 DIAGNOSIS — Z808 Family history of malignant neoplasm of other organs or systems: Secondary | ICD-10-CM

## 2023-03-16 DIAGNOSIS — Z85828 Personal history of other malignant neoplasm of skin: Secondary | ICD-10-CM

## 2023-03-16 DIAGNOSIS — D485 Neoplasm of uncertain behavior of skin: Secondary | ICD-10-CM

## 2023-03-16 DIAGNOSIS — D1801 Hemangioma of skin and subcutaneous tissue: Secondary | ICD-10-CM

## 2023-03-16 DIAGNOSIS — L821 Other seborrheic keratosis: Secondary | ICD-10-CM

## 2023-03-16 DIAGNOSIS — L570 Actinic keratosis: Secondary | ICD-10-CM

## 2023-03-16 DIAGNOSIS — C4492 Squamous cell carcinoma of skin, unspecified: Secondary | ICD-10-CM

## 2023-03-16 DIAGNOSIS — W908XXA Exposure to other nonionizing radiation, initial encounter: Secondary | ICD-10-CM | POA: Diagnosis not present

## 2023-03-16 HISTORY — DX: Squamous cell carcinoma of skin, unspecified: C44.92

## 2023-03-16 NOTE — Patient Instructions (Signed)
Cryotherapy Aftercare  Wash gently with soap and water everyday.   Apply Vaseline and Band-Aid daily until healed.    Patient Handout: Wound Care for Skin Biopsy Site  Taking Care of Your Skin Biopsy Site  Proper care of the biopsy site is essential for promoting healing and minimizing scarring. This handout provides instructions on how to care for your biopsy site to ensure optimal recovery.  1. Cleaning the Wound:  Clean the biopsy site daily with gentle soap and water. Gently pat the area dry with a clean, soft towel. Avoid harsh scrubbing or rubbing the area, as this can irritate the skin and delay healing.  2. Applying Aquaphor and Bandage:  After cleaning the wound, apply a thin layer of Aquaphor ointment to the biopsy site. Cover the area with a sterile bandage to protect it from dirt, bacteria, and friction. Change the bandage daily or as needed if it becomes soiled or wet.  3. Continued Care for One Week:  Repeat the cleaning, Aquaphor application, and bandaging process daily for one week following the biopsy procedure. Keeping the wound clean and moist during this initial healing period will help prevent infection and promote optimal healing.  4. Massaging Aquaphor into the Area:  ---After one week, discontinue the use of bandages but continue to apply Aquaphor to the biopsy site. ----Gently massage the Aquaphor into the area using circular motions. ---Massaging the skin helps to promote circulation and prevent the formation of scar tissue.   Additional Tips:  Avoid exposing the biopsy site to direct sunlight during the healing process, as this can cause hyperpigmentation or worsen scarring. If you experience any signs of infection, such as increased redness, swelling, warmth, or drainage from the wound, contact your healthcare provider immediately. Follow any additional instructions provided by your healthcare provider for caring for the biopsy site and managing any  discomfort. Conclusion:  Taking proper care of your skin biopsy site is crucial for ensuring optimal healing and minimizing scarring. By following these instructions for cleaning, applying Aquaphor, and massaging the area, you can promote a smooth and successful recovery. If you have any questions or concerns about caring for your biopsy site, don't hesitate to contact your healthcare provider for guidance.   Due to recent changes in healthcare laws, you may see results of your pathology and/or laboratory studies on MyChart before the doctors have had a chance to review them. We understand that in some cases there may be results that are confusing or concerning to you. Please understand that not all results are received at the same time and often the doctors may need to interpret multiple results in order to provide you with the best plan of care or course of treatment. Therefore, we ask that you please give [place] 2 business days to thoroughly review all your results before contacting the office for clarification. Should we see a critical lab result, you will be contacted sooner.   If You Need Anything After Your Visit  If you have any questions or concerns for your doctor, please call our main line at [phone] and press option 4 to reach your doctor's medical assistant. If no one answers, please leave a voicemail as directed and we will return your call as soon as possible. Messages left after 4 pm will be answered the following business day.   You may also send [place] a message via MyChart. We typically respond to MyChart messages within 1-2 business days.  For prescription refills, please ask your pharmacy  to contact our office. Our fax number is [phone].  If you have an urgent issue when the clinic is closed that cannot wait until the next business day, you can page your doctor at the number below.    Please note that while we do our best to be available for urgent issues outside of office  hours, we are not available 24/7.   If you have an urgent issue and are unable to reach [place], you may choose to seek medical care at your doctor's office, retail clinic, urgent care center, or emergency room.  If you have a medical emergency, please immediately call 911 or go to the emergency department.  Pager Numbers  - Dr. Gwen Pounds: [phone]  - Dr. Roseanne Reno: [phone]  - Dr. Katrinka Blazing: [phone]   In the event of inclement weather, please call our main line at [phone] for an update on the status of any delays or closures.  Dermatology Medication Tips: Please keep the boxes that topical medications come in in order to help keep track of the instructions about where and how to use these. Pharmacies typically print the medication instructions only on the boxes and not directly on the medication tubes.   If your medication is too expensive, please contact our office at [phone] option 4 or send [place] a message through MyChart.   We are unable to tell what your co-pay for medications will be in advance as this is different depending on your insurance coverage. However, we may be able to find a substitute medication at lower cost or fill out paperwork to get insurance to cover a needed medication.   If a prior authorization is required to get your medication covered by your insurance company, please allow [place] 1-2 business days to complete this process.  Drug prices often vary depending on where the prescription is filled and some pharmacies may offer cheaper prices.  The website www.goodrx.com contains coupons for medications through different pharmacies. The prices here do not account for what the cost may be with help from insurance (it may be cheaper with your insurance), but the website can give you the price if you did not use any insurance.  - You can print the associated coupon and take it with your prescription to the pharmacy.  - You may also stop by our office during  regular business hours and pick up a GoodRx coupon card.  - If you need your prescription sent electronically to a different pharmacy, notify our office through [facility] or by phone at [phone] option 4.

## 2023-03-16 NOTE — Progress Notes (Signed)
Follow-Up Visit   Subjective  Gregory Stark is a 82 y.o. male who presents for the following: AK follow up. Patient does use ketoconazole shampoo at scalp for seb derm and notices a scab at scalp that won't heal. Present for months. He also uses Xolegel and HC 2.5% cream for areas at temples of seb derm.   Patient also with spots at left ear and b/l preauricular.   The patient has spots, moles and lesions to be evaluated, some may be new or changing and the patient may have concern these could be cancer.  FAMILY HISTORY OF SKIN CANCER What type(s):melanoma Who affected:sister   The following portions of the chart were reviewed this encounter and updated as appropriate: medications, allergies, medical history  Review of Systems:  No other skin or systemic complaints except as noted in HPI or Assessment and Plan.  Objective  Well appearing patient in no apparent distress; mood and affect are within normal limits.   A focused examination was performed of the following areas: Scalp, face, ears  Relevant exam findings are noted in the Assessment and Plan.  L vertex scalp x 1, L preauricular x 1 (2) Erythematous thin papules/macules with gritty scale.   left superior helix 5 mm scaly pink papule R/o SCC       Assessment & Plan   SEBORRHEIC DERMATITIS Exam: Pink patches with greasy scale at postauricular and vertex scalp  Chronic, Flared, not at patient goal  Seborrheic Dermatitis is a chronic persistent rash characterized by pinkness and scaling most commonly of the mid face but also can occur on the scalp (dandruff), ears; mid chest, mid back and groin.  It tends to be exacerbated by stress and cooler weather.  People who have neurologic disease may experience new onset or exacerbation of existing seborrheic dermatitis.  The condition is not curable but treatable and can be controlled.  Treatment Plan: Continue ketoconazole 2% shampoo Continue HC 2.5% cream and  Xolegel daily as needed  HISTORY OF BASAL CELL CARCINOMA OF THE SKIN - No evidence of recurrence today - Recommend regular full body skin exams - Recommend daily broad spectrum sunscreen SPF 30+ to sun-exposed areas, reapply every 2 hours as needed.  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 They are chronic with expected duration over 1 year. A portion of actinic keratoses will progress to squamous cell carcinoma of the skin. It is not possible to reliably predict which spots will progress to skin cancer and so treatment is recommended to prevent development of skin cancer.  Recommend daily broad spectrum sunscreen SPF 30+ to sun-exposed areas, reapply every 2 hours as needed.  Recommend staying in the shade or wearing long sleeves, sun glasses (UVA+UVB protection) and wide brim hats (4-inch brim around the entire circumference of the hat). Call for new or changing lesions.   Destruction of lesion - L vertex scalp x 1, L preauricular x 1 (2)  Destruction method: cryotherapy   Informed consent: discussed and consent obtained   Lesion destroyed using liquid nitrogen: Yes   Cryotherapy cycles:  2 Outcome: patient tolerated procedure well with no complications   Post-procedure details: wound care instructions given    Neoplasm of uncertain behavior of skin left superior helix  Skin / nail biopsy Type  of biopsy: tangential   Informed consent: discussed and consent obtained   Timeout: patient name, date of birth, surgical site, and procedure verified   Procedure prep:  Patient was prepped and draped in usual sterile fashion Prep type:  Isopropyl alcohol Anesthesia: the lesion was anesthetized in a standard fashion   Anesthetic:  1% lidocaine w/ epinephrine 1-100,000  buffered w/ 8.4% NaHCO3 Instrument used: DermaBlade   Hemostasis achieved with: pressure, aluminum chloride and electrodesiccation   Outcome: patient tolerated procedure well   Post-procedure details: sterile dressing applied and wound care instructions given   Dressing type: bandage and petrolatum    Specimen 1 - Surgical pathology Differential Diagnosis: r/o SCC  Check Margins: No 5 mm scaly pink papule   HEMANGIOMA Exam: red papule(s) left postauricular Discussed benign nature. Recommend observation. Call for changes.  SEBORRHEIC KERATOSIS - Stuck-on, waxy, tan-brown papules and/or plaques  - Benign-appearing - Discussed benign etiology and prognosis. - Observe - Call for any changes    Return in about 6 months (around 09/13/2023) for Hx BCC, Hx AK, with Dr. Katrinka Blazing, TBSE.  Anise Salvo, RMA, am acting as scribe for Elie Goody, MD .   Documentation: I have reviewed the above documentation for accuracy and completeness, and I agree with the above.  Elie Goody, MD

## 2023-03-19 LAB — SURGICAL PATHOLOGY

## 2023-03-22 ENCOUNTER — Telehealth: Payer: Self-pay

## 2023-03-22 DIAGNOSIS — C4492 Squamous cell carcinoma of skin, unspecified: Secondary | ICD-10-CM

## 2023-03-22 NOTE — Telephone Encounter (Signed)
Patient advised pathology showed SCC, [facility] referral sent [facility] per patient preference.  Butch Penny., RMA

## 2023-03-22 NOTE — Telephone Encounter (Signed)
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  Explanation: This is a squamous cell skin cancer that has grown beyond the surface of the skin and is invading the second layer of the skin. It has the potential to spread beyond the skin and threaten your health, so I recommend treating it.  Treatment: Given the location and type of skin cancer, I recommend Mohs surgery. Mohs surgery involves cutting out the skin cancer and then checking under the microscope to ensure the whole skin cancer was removed. If any skin cancer remains, the surgeon will cut out more until it is fully removed. The cure rate is about 98-99%. Once the Mohs surgeon confirms the skin cancer is out, they will discuss the options to repair or heal the area. You must take it easy for about two weeks after surgery (no lifting over 10-15 lbs, avoid activity to get your heart rate and blood pressure up). It is done at another office outside of [place] ([place], [place], or [place]).  If the patient asks for my recommendation for Mohs surgeon, I recommend Dr Caprice Beaver or Dr Coralie Carpen at [facility] if the patient is able to make the trip.

## 2023-04-29 ENCOUNTER — Telehealth: Payer: Self-pay

## 2023-04-29 NOTE — Telephone Encounter (Signed)
Updated specimen tracking and history from [facility] progress notes and photos of L superior helix. aw

## 2023-05-01 ENCOUNTER — Other Ambulatory Visit: Payer: Self-pay | Admitting: Medical Genetics

## 2023-05-01 DIAGNOSIS — Z006 Encounter for examination for normal comparison and control in clinical research program: Secondary | ICD-10-CM

## 2023-05-12 ENCOUNTER — Other Ambulatory Visit
Admission: RE | Admit: 2023-05-12 | Discharge: 2023-05-12 | Disposition: A | Discharge status: Home / Self Care | Payer: Medicare Other | Source: Ambulatory Visit | Attending: Medical Genetics | Admitting: Medical Genetics

## 2023-05-12 DIAGNOSIS — Z006 Encounter for examination for normal comparison and control in clinical research program: Secondary | ICD-10-CM | POA: Insufficient documentation

## 2023-05-21 LAB — HELIX MOLECULAR SCREEN: Genetic Analysis Overall Interpretation: NEGATIVE

## 2023-05-21 LAB — GENECONNECT MOLECULAR SCREEN

## 2023-07-12 ENCOUNTER — Encounter (INDEPENDENT_AMBULATORY_CARE_PROVIDER_SITE_OTHER): Payer: Self-pay | Admitting: Vascular Surgery

## 2023-07-12 NOTE — Telephone Encounter (Signed)
 The carotid duplex done on 05/06/2023

## 2023-07-12 NOTE — Telephone Encounter (Signed)
 Can we call over to [facility] to get a copy of those results, we can't see them in [facility]

## 2023-07-13 NOTE — Telephone Encounter (Addendum)
 I waited on hold for 24 minutes before hanging up. Gelsa at the front desk called and she waited a while as well, so she sent over a fax requesting the [place].

## 2023-07-14 NOTE — Telephone Encounter (Signed)
 Please let me know once we have received it.

## 2023-07-19 ENCOUNTER — Ambulatory Visit (INDEPENDENT_AMBULATORY_CARE_PROVIDER_SITE_OTHER): Payer: Medicare Other | Admitting: Vascular Surgery

## 2023-07-19 ENCOUNTER — Encounter (INDEPENDENT_AMBULATORY_CARE_PROVIDER_SITE_OTHER): Payer: Medicare Other

## 2023-08-10 ENCOUNTER — Other Ambulatory Visit: Payer: Self-pay | Admitting: Specialist

## 2023-08-10 DIAGNOSIS — J849 Interstitial pulmonary disease, unspecified: Secondary | ICD-10-CM

## 2023-08-16 ENCOUNTER — Ambulatory Visit
Admission: RE | Admit: 2023-08-16 | Discharge: 2023-08-16 | Disposition: A | Discharge status: Home / Self Care | Payer: Medicare Other | Source: Ambulatory Visit | Attending: Specialist | Admitting: Specialist

## 2023-08-16 DIAGNOSIS — J849 Interstitial pulmonary disease, unspecified: Secondary | ICD-10-CM | POA: Diagnosis present

## 2023-09-14 ENCOUNTER — Ambulatory Visit (INDEPENDENT_AMBULATORY_CARE_PROVIDER_SITE_OTHER): Payer: Medicare Other | Admitting: Dermatology

## 2023-09-14 ENCOUNTER — Encounter: Payer: Self-pay | Admitting: Dermatology

## 2023-09-14 DIAGNOSIS — D229 Melanocytic nevi, unspecified: Secondary | ICD-10-CM

## 2023-09-14 DIAGNOSIS — Z1283 Encounter for screening for malignant neoplasm of skin: Secondary | ICD-10-CM

## 2023-09-14 DIAGNOSIS — W908XXA Exposure to other nonionizing radiation, initial encounter: Secondary | ICD-10-CM

## 2023-09-14 DIAGNOSIS — L814 Other melanin hyperpigmentation: Secondary | ICD-10-CM | POA: Diagnosis not present

## 2023-09-14 DIAGNOSIS — L570 Actinic keratosis: Secondary | ICD-10-CM

## 2023-09-14 DIAGNOSIS — Z85828 Personal history of other malignant neoplasm of skin: Secondary | ICD-10-CM

## 2023-09-14 DIAGNOSIS — L578 Other skin changes due to chronic exposure to nonionizing radiation: Secondary | ICD-10-CM | POA: Diagnosis not present

## 2023-09-14 DIAGNOSIS — D2361 Other benign neoplasm of skin of right upper limb, including shoulder: Secondary | ICD-10-CM

## 2023-09-14 DIAGNOSIS — Z808 Family history of malignant neoplasm of other organs or systems: Secondary | ICD-10-CM

## 2023-09-14 DIAGNOSIS — L821 Other seborrheic keratosis: Secondary | ICD-10-CM

## 2023-09-14 DIAGNOSIS — D1801 Hemangioma of skin and subcutaneous tissue: Secondary | ICD-10-CM

## 2023-09-14 DIAGNOSIS — D239 Other benign neoplasm of skin, unspecified: Secondary | ICD-10-CM

## 2023-09-14 NOTE — Patient Instructions (Signed)

## 2023-09-14 NOTE — Progress Notes (Signed)
 Follow-Up Visit   Subjective  Gregory Stark is a 83 y.o. male who presents for the following: Skin Cancer Screening and Full Body Skin Exam  The patient presents for Total-Body Skin Exam (TBSE) for skin cancer screening and mole check. The patient has spots, moles and lesions to be evaluated, some may be new or changing and the patient may have concern these could be cancer.  The following portions of the chart were reviewed this encounter and updated as appropriate: medications, allergies, medical history  Review of Systems:  No other skin or systemic complaints except as noted in HPI or Assessment and Plan.  Objective  Well appearing patient in no apparent distress; mood and affect are within normal limits.  A full examination was performed including scalp, head, eyes, ears, nose, lips, neck, chest, axillae, abdomen, back, buttocks, bilateral upper extremities, bilateral lower extremities, hands, feet, fingers, toes, fingernails, and toenails. All findings within normal limits unless otherwise noted below.   Relevant physical exam findings are noted in the Assessment and Plan.  R post auricular x 2, R mid cheek x 1, R sup forehead x 1, L lat forehead x 1, L temple x 1, L mid helix x 1, L vertex x 1 (8) Erythematous thin papules/macules with gritty scale.   Assessment & Plan   SKIN CANCER SCREENING PERFORMED TODAY.  ACTINIC DAMAGE - Chronic condition, secondary to cumulative UV/sun exposure - diffuse scaly erythematous macules with underlying dyspigmentation - Recommend daily broad spectrum sunscreen SPF 30+ to sun-exposed areas, reapply every 2 hours as needed.  - Staying in the shade or wearing long sleeves, sun glasses (UVA+UVB protection) and wide brim hats (4-inch brim around the entire circumference of the hat) are also recommended for sun protection.  - Call for new or changing lesions.  LENTIGINES, SEBORRHEIC KERATOSES, HEMANGIOMAS - Benign normal skin lesions -  Benign-appearing - Call for any changes  MELANOCYTIC NEVI - Tan-brown and/or pink-flesh-colored symmetric macules and papules - Benign appearing on exam today - Observation - Call clinic for new or changing moles - Recommend daily use of broad spectrum spf 30+ sunscreen to sun-exposed areas.   DERMATOFIBROMA Exam: Firm pink/brown papulenodule with dimple sign. R shoulder Treatment Plan: A dermatofibroma is a benign growth possibly related to trauma, such as an insect bite, cut from shaving, or inflamed acne-type bump.  Treatment options to remove include shave or excision with resulting scar and risk of recurrence.  Since benign-appearing and not bothersome, will observe for now.   HISTORY OF BASAL CELL CARCINOMA OF THE SKIN - No evidence of recurrence today - Recommend regular full body skin exams - Recommend daily broad spectrum sunscreen SPF 30+ to sun-exposed areas, reapply every 2 hours as needed.  - Call if any new or changing lesions are noted between office visits  HISTORY OF SQUAMOUS CELL CARCINOMA OF THE SKIN - No evidence of recurrence today - No lymphadenopathy - Recommend regular full body skin exams - Recommend daily broad spectrum sunscreen SPF 30+ to sun-exposed areas, reapply every 2 hours as needed.  - Call if any new or changing lesions are noted between office visits  FAMILY HISTORY OF SKIN CANCER What type(s):melanoma Who affected:sister AK (ACTINIC KERATOSIS) (8) R post auricular x 2, R mid cheek x 1, R sup forehead x 1, L lat forehead x 1, L temple x 1, L mid helix x 1, L vertex x 1 (8) Actinic keratoses are precancerous spots that appear secondary to cumulative UV radiation  exposure/sun exposure over time. They are chronic with expected duration over 1 year. A portion of actinic keratoses will progress to squamous cell carcinoma of the skin. It is not possible to reliably predict which spots will progress to skin cancer and so treatment is recommended to  prevent development of skin cancer.  Recommend daily broad spectrum sunscreen SPF 30+ to sun-exposed areas, reapply every 2 hours as needed.  Recommend staying in the shade or wearing long sleeves, sun glasses (UVA+UVB protection) and wide brim hats (4-inch brim around the entire circumference of the hat). Call for new or changing lesions.  Destruction of lesion - R post auricular x 2, R mid cheek x 1, R sup forehead x 1, L lat forehead x 1, L temple x 1, L mid helix x 1, L vertex x 1 (8) Complexity: simple   Destruction method: cryotherapy   Informed consent: discussed and consent obtained   Timeout:  patient name, date of birth, surgical site, and procedure verified Lesion destroyed using liquid nitrogen: Yes   Region frozen until ice ball extended beyond lesion: Yes   Outcome: patient tolerated procedure well with no complications   Post-procedure details: wound care instructions given   MULTIPLE BENIGN NEVI   LENTIGINES   ACTINIC ELASTOSIS   DERMATOFIBROMA   SEBORRHEIC KERATOSES   CHERRY ANGIOMA    Return in about 6 months (around 03/16/2024) for TBSE - hx SCC.  Maylene Roes, CMA, am acting as scribe for Elie Goody, MD .   Documentation: I have reviewed the above documentation for accuracy and completeness, and I agree with the above.  Elie Goody, MD

## 2023-09-29 ENCOUNTER — Ambulatory Visit (INDEPENDENT_AMBULATORY_CARE_PROVIDER_SITE_OTHER): Admitting: Podiatry

## 2023-09-29 ENCOUNTER — Encounter: Payer: Self-pay | Admitting: Podiatry

## 2023-09-29 ENCOUNTER — Ambulatory Visit (INDEPENDENT_AMBULATORY_CARE_PROVIDER_SITE_OTHER)

## 2023-09-29 DIAGNOSIS — M722 Plantar fascial fibromatosis: Secondary | ICD-10-CM | POA: Diagnosis not present

## 2023-09-29 MED ORDER — TRIAMCINOLONE ACETONIDE 40 MG/ML IJ SUSP
20.0000 mg | Freq: Once | INTRAMUSCULAR | Status: AC
  Administered 2023-09-29: 20 mg

## 2023-09-29 MED ORDER — MELOXICAM 15 MG PO TABS: 15.0000 mg | ORAL_TABLET | Freq: Every day | ORAL | 3 refills | Status: DC

## 2023-09-29 MED ORDER — METHYLPREDNISOLONE 4 MG PO TBPK: ORAL_TABLET | ORAL | 0 refills | Status: DC

## 2023-09-29 NOTE — Patient Instructions (Signed)

## 2023-09-29 NOTE — Progress Notes (Signed)
 Subjective:  Patient ID: Gregory Stark, male    DOB: Feb 02, 1941,  MRN: [id number] HPI Chief Complaint  Patient presents with   Foot Pain    Plantar heel left - aching x 2 weeks, AM pain, active in golf and tried to wear sneakers instead of golf shoes, stretching AM    New Patient (Initial Visit)    83 y.o. male presents with the above complaint.   ROS: Denies fever chills nausea mobic muscle aches pains calf pain back pain chest pain shortness of breath.  Past Medical History:  Diagnosis Date   Actinic keratosis    Basal cell carcinoma 03/23/2019   Right chest. Superficial and nodular patterns. Tx: EDC   Cancer (HCC)    chest   Gastroesophageal reflux disease    Hyperlipidemia    Hypertension    Peripheral vascular disease (HCC)    SCC (squamous cell carcinoma) 03/16/2023   left superior helix, [facility] 04/14/2023   Past Surgical History:  Procedure Laterality Date   arthroscoppy knee Left    CATARACT EXTRACTION, BILATERAL     ENDARTERECTOMY Right 08/09/2019   Procedure: ENDARTERECTOMY CAROTID;  Surgeon: Renford Dills, MD;  Location: [facility] ORS;  Service: Vascular;  Laterality: Right;    Current Outpatient Medications:    meloxicam (MOBIC) 15 MG tablet, Take 1 tablet (15 mg total) by mouth daily., Disp: 30 tablet, Rfl: 3   methylPREDNISolone (MEDROL DOSEPAK) 4 MG TBPK tablet, 6 day dose pack - take as directed, Disp: 21 tablet, Rfl: 0   aspirin EC 81 MG tablet, Take 81 mg by mouth daily. Swallow whole., Disp: , Rfl:    lisinopril (ZESTRIL) 10 MG tablet, Take 10 mg by mouth at bedtime. , Disp: , Rfl:    rosuvastatin (CRESTOR) 10 MG tablet, Take 10 mg by mouth at bedtime., Disp: , Rfl:    Vitamin D, Ergocalciferol, (DRISDOL) 1.25 MG (50000 UT) CAPS capsule, Take 50,000 Units by mouth every 7 (seven) days., Disp: , Rfl:   No Known Allergies Review of Systems Objective:  There were no vitals filed for this visit.  General: Well developed, nourished, in no acute distress,  alert and oriented x3   Dermatological: Skin is warm, dry and supple bilateral. Nails x 10 are well maintained; remaining integument appears unremarkable at this time. There are no open sores, no preulcerative lesions, no rash or signs of infection present.  Vascular: Dorsalis Pedis artery and Posterior Tibial artery pedal pulses are 2/4 bilateral with immedate capillary fill time. Pedal hair growth present. No varicosities and no lower extremity edema present bilateral.   Neruologic: Grossly intact via light touch bilateral. Vibratory intact via tuning fork bilateral. Protective threshold with Semmes Wienstein monofilament intact to all pedal sites bilateral. Patellar and Achilles deep tendon reflexes 2+ bilateral. No Babinski or clonus noted bilateral.   Musculoskeletal: No gross boney pedal deformities bilateral. No pain, crepitus, or limitation noted with foot and ankle range of motion bilateral. Muscular strength 5/5 in all groups tested bilateral.  Pain on palpation medial calcaneal tubercle of the left heel.  No pain on medial and lateral compression of the calcaneus.  Gait: Unassisted, Nonantalgic.    Radiographs:  Radiographs taken today demonstrate osseously mature individual with mild demineralized bone.  He has a soft tissue increase in density of plantar fascial kidney insertion site with small plantar distally oriented calcaneal heel spur.  Assessment & Plan:   Assessment: Plantar fasciitis left.  Plan: Discussed etiology pathology conservative versus surgical therapies.  Started him on methylprednisolone to be followed by meloxicam.  Injected 20 mg of Kenalog milligrams Marcaine to the point of maximal tenderness of that left heel.  Tolerated procedure well without complications discussed appropriate shoe gear stretching exercises ice therapy and shoe gear modifications.  Started him on a plantar fascia brace.  Follow-up with him in 1 month     Aerabella Galasso T. [place], [place]

## 2023-11-23 ENCOUNTER — Encounter (INDEPENDENT_AMBULATORY_CARE_PROVIDER_SITE_OTHER): Payer: Self-pay

## 2024-03-21 ENCOUNTER — Ambulatory Visit: Admitting: Dermatology

## 2024-03-23 ENCOUNTER — Encounter: Payer: Self-pay | Admitting: Dermatology

## 2024-03-23 ENCOUNTER — Ambulatory Visit (INDEPENDENT_AMBULATORY_CARE_PROVIDER_SITE_OTHER): Admitting: Dermatology

## 2024-03-23 DIAGNOSIS — D229 Melanocytic nevi, unspecified: Secondary | ICD-10-CM

## 2024-03-23 DIAGNOSIS — L814 Other melanin hyperpigmentation: Secondary | ICD-10-CM | POA: Diagnosis not present

## 2024-03-23 DIAGNOSIS — L578 Other skin changes due to chronic exposure to nonionizing radiation: Secondary | ICD-10-CM

## 2024-03-23 DIAGNOSIS — D048 Carcinoma in situ of skin of other sites: Secondary | ICD-10-CM

## 2024-03-23 DIAGNOSIS — Z1283 Encounter for screening for malignant neoplasm of skin: Secondary | ICD-10-CM | POA: Diagnosis not present

## 2024-03-23 DIAGNOSIS — W908XXA Exposure to other nonionizing radiation, initial encounter: Secondary | ICD-10-CM | POA: Diagnosis not present

## 2024-03-23 DIAGNOSIS — Z85828 Personal history of other malignant neoplasm of skin: Secondary | ICD-10-CM

## 2024-03-23 DIAGNOSIS — D492 Neoplasm of unspecified behavior of bone, soft tissue, and skin: Secondary | ICD-10-CM

## 2024-03-23 DIAGNOSIS — L570 Actinic keratosis: Secondary | ICD-10-CM | POA: Diagnosis not present

## 2024-03-23 DIAGNOSIS — L821 Other seborrheic keratosis: Secondary | ICD-10-CM

## 2024-03-23 DIAGNOSIS — D099 Carcinoma in situ, unspecified: Secondary | ICD-10-CM

## 2024-03-23 DIAGNOSIS — Z808 Family history of malignant neoplasm of other organs or systems: Secondary | ICD-10-CM

## 2024-03-23 DIAGNOSIS — D1801 Hemangioma of skin and subcutaneous tissue: Secondary | ICD-10-CM

## 2024-03-23 DIAGNOSIS — L219 Seborrheic dermatitis, unspecified: Secondary | ICD-10-CM

## 2024-03-23 HISTORY — DX: Carcinoma in situ, unspecified: D09.9

## 2024-03-23 NOTE — Patient Instructions (Addendum)

## 2024-03-23 NOTE — Progress Notes (Signed)
 Follow-Up Visit   Subjective  Gregory Stark is a 83 y.o. male who presents for the following: Skin Cancer Screening and Full Body Skin Exam hx of BCC, SCC, Aks, pt had spots frozen on L ear, R ear, R forehead from mobile Dermatology at [facility], hx of Seborrheic Dermatitis scalp mobile derm re-prescribed Ketoconazole  2% shampoo  The patient presents for Total-Body Skin Exam (TBSE) for skin cancer screening and mole check. The patient has spots, moles and lesions to be evaluated, some may be new or changing and the patient may have concern these could be cancer.    The following portions of the chart were reviewed this encounter and updated as appropriate: medications, allergies, medical history  Review of Systems:  No other skin or systemic complaints except as noted in HPI or Assessment and Plan.  Objective  Well appearing patient in no apparent distress; mood and affect are within normal limits.  A full examination was performed including scalp, head, eyes, ears, nose, lips, neck, chest, axillae, abdomen, back, buttocks, bilateral upper extremities, bilateral lower extremities, hands, feet, fingers, toes, fingernails, and toenails. All findings within normal limits unless otherwise noted below.   Relevant physical exam findings are noted in the Assessment and Plan.  L zygoma x 1, R postauricular x 2, (3) Pink scaly macules R postauricular 6.69mm pink scaly pap   Assessment & Plan   SKIN CANCER SCREENING PERFORMED TODAY.  ACTINIC DAMAGE - Chronic condition, secondary to cumulative UV/sun exposure - diffuse scaly erythematous macules with underlying dyspigmentation - Recommend daily broad spectrum sunscreen SPF 30+ to sun-exposed areas, reapply every 2 hours as needed.  - Staying in the shade or wearing long sleeves, sun glasses (UVA+UVB protection) and wide brim hats (4-inch brim around the entire circumference of the hat) are also recommended for sun protection.  - Call for  new or changing lesions.  LENTIGINES, SEBORRHEIC KERATOSES, HEMANGIOMAS - Benign normal skin lesions - Benign-appearing - Call for any changes  MELANOCYTIC NEVI - Tan-brown and/or pink-flesh-colored symmetric macules and papules - Benign appearing on exam today - Observation - Call clinic for new or changing moles - Recommend daily use of broad spectrum spf 30+ sunscreen to sun-exposed areas.   HISTORY OF BASAL CELL CARCINOMA OF THE SKIN - No evidence of recurrence today - Recommend regular full body skin exams - Recommend daily broad spectrum sunscreen SPF 30+ to sun-exposed areas, reapply every 2 hours as needed.  - Call if any new or changing lesions are noted between office visits  - R chest  HISTORY OF SQUAMOUS CELL CARCINOMA OF THE SKIN - No evidence of recurrence today - No lymphadenopathy - Recommend regular full body skin exams - Recommend daily broad spectrum sunscreen SPF 30+ to sun-exposed areas, reapply every 2 hours as needed.  - Call if any new or changing lesions are noted between office visits - L sup helix, [facility] 04/14/2023  FAMILY HISTORY OF SKIN CANCER What type(s):melanoma Who affected: sister   SEBORRHEIC DERMATITIS Scalp, face Exam: Pink patches with greasy scale at scalp, sideburns  Chronic and persistent condition with duration or expected duration over one year. Condition is symptomatic/ bothersome to patient. Not currently at goal.   Seborrheic Dermatitis is a chronic persistent rash characterized by pinkness and scaling most commonly of the mid face but also can occur on the scalp (dandruff), ears; mid chest, mid back and groin.  It tends to be exacerbated by stress and cooler weather.  People who have  neurologic disease may experience new onset or exacerbation of existing seborrheic dermatitis.  The condition is not curable but treatable and can be controlled.  Treatment Plan: Cont Ketoconazole  2% shampoo 2x/wk, let sit 5 minutes and rinse  out Cont Ketoconazole  2% cr qd prn flares Cont HC 2.5% cr qd prn flares  Topical steroids (such as triamcinolone , fluocinolone, fluocinonide, mometasone, clobetasol, halobetasol, betamethasone, hydrocortisone ) can cause thinning and lightening of the skin if they are used for too long in the same area. Your physician has selected the right strength medicine for your problem and area affected on the body. Please use your medication only as directed by your physician to prevent side effects.   Long term medication management.  Patient is using long term (months to years) prescription medication  to control their dermatologic condition.  These medications require periodic monitoring to evaluate for efficacy and side effects and may require periodic laboratory monitoring.     AK (ACTINIC KERATOSIS) (3) L zygoma x 1, R postauricular x 2, (3) Actinic keratoses are precancerous spots that appear secondary to cumulative UV radiation exposure/sun exposure over time. They are chronic with expected duration over 1 year. A portion of actinic keratoses will progress to squamous cell carcinoma of the skin. It is not possible to reliably predict which spots will progress to skin cancer and so treatment is recommended to prevent development of skin cancer.  Recommend daily broad spectrum sunscreen SPF 30+ to sun-exposed areas, reapply every 2 hours as needed.  Recommend staying in the shade or wearing long sleeves, sun glasses (UVA+UVB protection) and wide brim hats (4-inch brim around the entire circumference of the hat). Call for new or changing lesions. Destruction of lesion - L zygoma x 1, R postauricular x 2, (3) Complexity: simple   Destruction method: cryotherapy   Informed consent: discussed and consent obtained   Timeout:  patient name, date of birth, surgical site, and procedure verified Lesion destroyed using liquid nitrogen: Yes   Region frozen until ice ball extended beyond lesion: Yes   Cryo  cycles: 1 or 2. Outcome: patient tolerated procedure well with no complications   Post-procedure details: wound care instructions given    NEOPLASM OF SKIN R postauricular Skin / nail biopsy Type of biopsy: tangential   Informed consent: discussed and consent obtained   Timeout: patient name, date of birth, surgical site, and procedure verified   Procedure prep:  Patient was prepped and draped in usual sterile fashion Prep type:  Isopropyl alcohol Anesthesia: the lesion was anesthetized in a standard fashion   Anesthetic:  1% lidocaine  w/ epinephrine 1-100,000 buffered w/ 8.4% NaHCO3 Instrument used: DermaBlade   Hemostasis achieved with: pressure and aluminum chloride   Outcome: patient tolerated procedure well   Post-procedure details: sterile dressing applied and wound care instructions given   Dressing type: bandage (mupirocin)    Specimen 1 - Surgical pathology Differential Diagnosis: ISK vs SCC  Check Margins: No 6.6mm pink scaly pap MULTIPLE BENIGN NEVI   LENTIGINES   ACTINIC ELASTOSIS   SEBORRHEIC KERATOSES   CHERRY ANGIOMA    Return in about 6 months (around 09/20/2024) for TBSE, Hx of BCC, Hx of AKs.  I, Grayce Saunas, RMA, am acting as scribe for Boneta Sharps, MD .   Documentation: I have reviewed the above documentation for accuracy and completeness, and I agree with the above.  Boneta Sharps, MD

## 2024-03-24 LAB — SURGICAL PATHOLOGY

## 2024-03-26 ENCOUNTER — Ambulatory Visit: Payer: Self-pay | Admitting: Dermatology

## 2024-03-27 ENCOUNTER — Encounter: Payer: Self-pay | Admitting: Dermatology

## 2024-03-27 NOTE — Telephone Encounter (Signed)
-----   Message from [place] sent at 03/26/2024  9:52 PM EDT ----- Diagnosis R postauricular :       SQUAMOUS CELL CARCINOMA IN SITU, BASE INVOLVED    Please call with diagnosis and message me with patient's decision on treatment.   Explanation: Biopsy shows a squamous cell skin cancer limited to the top layer of skin. This means it is an early cancer and has not spread. However, it has the potential to spread beyond the skin  and threaten your health, so we recommend treating it.   Treatment option 1: a cream (fluorouracil and calcipotriene) that helps your immune system clear the skin cancer. It will cause redness and irritation. Wait two weeks after the biopsy to start  applying the cream. Apply the cream twice per day until the redness and irritation develop (usually occurs by day 7), then stop and allow it to heal. We will recheck the area in 2 months to ensure  the cancer is gone. The cream is $45 plus shipping and will be mailed to you from a low cost compounding pharmacy.  Treatment option 2: you return for a brief appointment where I perform electrodesiccation and curettage [facility]). This involves three rounds of scraping and burning to destroy the skin cancer. It has  about an 85% cure rate and leaves a round wound slightly larger than the skin cancer and leaves a round white scar. No additional pathology is done. If the skin cancer comes back, we would need to do  a surgery to remove it.  ----- Message ----- From: Interface, Lab In Three Zero One Sent: 03/24/2024   4:41 PM EDT To: Boneta Sharps, MD

## 2024-03-27 NOTE — Telephone Encounter (Signed)
 LVM for patient to call office with treatment choice. Results seen on MyChart. Lonell RAMAN., RMA

## 2024-03-28 NOTE — Telephone Encounter (Signed)
 Patient advised of BX result and wants to schedule EDC.  Scheduled 04/24/24. aw

## 2024-04-10 ENCOUNTER — Ambulatory Visit: Admitting: Dermatology

## 2024-04-24 ENCOUNTER — Encounter: Payer: Self-pay | Admitting: Dermatology

## 2024-04-24 ENCOUNTER — Ambulatory Visit: Admitting: Dermatology

## 2024-04-24 DIAGNOSIS — D0421 Carcinoma in situ of skin of right ear and external auricular canal: Secondary | ICD-10-CM

## 2024-04-24 DIAGNOSIS — L820 Inflamed seborrheic keratosis: Secondary | ICD-10-CM

## 2024-04-24 DIAGNOSIS — D099 Carcinoma in situ, unspecified: Secondary | ICD-10-CM

## 2024-04-24 NOTE — Progress Notes (Signed)
   Follow-Up Visit   Subjective  Gregory Stark is a 83 y.o. male who presents for the following: Tx of SCCis. Right postauricular. Bx: 03/23/2024.    The following portions of the chart were reviewed this encounter and updated as appropriate: medications, allergies, medical history  Review of Systems:  No other skin or systemic complaints except as noted in HPI or Assessment and Plan.  Objective  Well appearing patient in no apparent distress; mood and affect are within normal limits.  A focused examination was performed of the following areas: Face, neck  Relevant physical exam findings are noted in the Assessment and Plan.  Right Postauricular Area Pink scar tissue Right Postauricular Area Erythematous keratotic or waxy stuck-on papule or plaque.  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 Destruction of lesion Complexity: simple   Destruction method: electrodesiccation and curettage   Informed consent: discussed and consent obtained   Timeout:  patient name, date of birth, surgical site, and procedure verified Procedure prep:  Patient was prepped and draped in usual sterile fashion Prep type:  Isopropyl alcohol Anesthesia: the lesion was anesthetized in a standard fashion   Anesthetic:  1% lidocaine  w/ epinephrine 1-100,000 local infiltration Curettage performed in three different directions: Yes   Electrodesiccation performed over the curetted area: Yes   Curettage cycles:  3 Final wound size (cm):  1 Hemostasis achieved with:  aluminum chloride and electrodesiccation Outcome: patient tolerated procedure well with no complications   Post-procedure details: sterile dressing applied and wound care instructions given   Dressing type: bandage and petrolatum    INFLAMED SEBORRHEIC KERATOSIS Right Postauricular Area Patient deferred treatment at this time.    Return for TBSE As Scheduled.  I, Jill Parcell, CMA, am acting as scribe  for Boneta Sharps, MD.   Documentation: I have reviewed the above documentation for accuracy and completeness, and I agree with the above.  Boneta Sharps, MD

## 2024-04-24 NOTE — Patient Instructions (Signed)
 Electrodesiccation and Curettage ("Scrape and Burn") Wound Care Instructions  Leave the original bandage on for 24 hours if possible.  If the bandage becomes soaked or soiled before that time, it is OK to remove it and examine the wound.  A small amount of post-operative bleeding is normal.  If excessive bleeding occurs, remove the bandage, place gauze over the site and apply continuous pressure (no peeking) over the area for 30 minutes. If this does not work, please call our clinic as soon as possible or page your doctor if it is after hours.   Once a day, cleanse the wound with soap and water. It is fine to shower. If a thick crust develops you may use a Q-tip dipped into dilute hydrogen peroxide (mix 1:1 with water) to dissolve it.  Hydrogen peroxide can slow the healing process, so use it only as needed.    After washing, apply petroleum jelly (Vaseline) or an antibiotic ointment if your doctor prescribed one for you, followed by a bandage.    For best healing, the wound should be covered with a layer of ointment at all times. If you are not able to keep the area covered with a bandage to hold the ointment in place, this may mean re-applying the ointment several times a day.  Continue this wound care until the wound has healed and is no longer open. It may take several weeks for the wound to heal and close.  Itching and mild discomfort is normal during the healing process.  If you have any discomfort, you can take Tylenol  (acetaminophen ) or ibuprofen as directed on the bottle. (Please do not take these if you have an allergy to them or cannot take them for another reason).  Some redness, tenderness and white or yellow material in the wound is normal healing.  If the area becomes very sore and red, or develops a thick yellow-green material (pus), it may be infected; please notify us .    Wound healing continues for up to one year following surgery. It is not unusual to experience pain in the scar  from time to time during the interval.  If the pain becomes severe or the scar thickens, you should notify the office.    A slight amount of redness in a scar is expected for the first six months.  After six months, the redness will fade and the scar will soften and fade.  The color difference becomes less noticeable with time.  If there are any problems, return for a post-op surgery check at your earliest convenience.  To improve the appearance of the scar, you can use silicone scar gel, cream, or sheets (such as Mederma or Serica) every night for up to one year. These are available over the counter (without a prescription).  Please call our office at [phone] for any questions or concerns.  Due to recent changes in healthcare laws, you may see results of your pathology and/or laboratory studies on MyChart before the doctors have had a chance to review them. We understand that in some cases there may be results that are confusing or concerning to you. Please understand that not all results are received at the same time and often the doctors may need to interpret multiple results in order to provide you with the best plan of care or course of treatment. Therefore, we ask that you please give us  2 business days to thoroughly review all your results before contacting the office for clarification. Should we see a  critical lab result, you will be contacted sooner.   If You Need Anything After Your Visit  If you have any questions or concerns for your doctor, please call our main line at [phone] and press option 4 to reach your doctor's medical assistant. If no one answers, please leave a voicemail as directed and we will return your call as soon as possible. Messages left after 4 pm will be answered the following business day.   You may also send us  a message via MyChart. We typically respond to MyChart messages within 1-2 business days.  For prescription refills, please ask your pharmacy to  contact our office. Our fax number is [phone].  If you have an urgent issue when the clinic is closed that cannot wait until the next business day, you can page your doctor at the number below.    Please note that while we do our best to be available for urgent issues outside of office hours, we are not available 24/7.   If you have an urgent issue and are unable to reach us , you may choose to seek medical care at your doctor's office, retail clinic, urgent care center, or emergency room.  If you have a medical emergency, please immediately call 911 or go to the emergency department.  Pager Numbers  - Dr. Hester: [phone]  - Dr. Jackquline: [phone]  - Dr. Claudene: [phone]   - Dr. Raymund: [phone]  In the event of inclement weather, please call our main line at [phone] for an update on the status of any delays or closures.  Dermatology Medication Tips: Please keep the boxes that topical medications come in in order to help keep track of the instructions about where and how to use these. Pharmacies typically print the medication instructions only on the boxes and not directly on the medication tubes.   If your medication is too expensive, please contact our office at [phone] option 4 or send us  a message through MyChart.   We are unable to tell what your co-pay for medications will be in advance as this is different depending on your insurance coverage. However, we may be able to find a substitute medication at lower cost or fill out paperwork to get insurance to cover a needed medication.   If a prior authorization is required to get your medication covered by your insurance company, please allow us  1-2 business days to complete this process.  Drug prices often vary depending on where the prescription is filled and some pharmacies may offer cheaper prices.  The website www.goodrx.com contains coupons for medications through different pharmacies. The prices  here do not account for what the cost may be with help from insurance (it may be cheaper with your insurance), but the website can give you the price if you did not use any insurance.  - You can print the associated coupon and take it with your prescription to the pharmacy.  - You may also stop by our office during regular business hours and pick up a GoodRx coupon card.  - If you need your prescription sent electronically to a different pharmacy, notify our office through [facility] or by phone at [phone] option 4.     Si Usted Necesita Algo Despus de Su Visita  Tambin puede enviarnos un mensaje a travs de Clinical cytogeneticist. Por lo general respondemos a los mensajes de MyChart en el transcurso de 1 a 2 das hbiles.  Para renovar recetas, por favor pida a su farmacia que  se ponga en contacto con nuestra oficina. Randi lakes de fax es [place] [phone].  Si tiene un asunto urgente cuando la clnica est cerrada y que no puede esperar hasta el siguiente da hbil, puede llamar/localizar a su doctor(a) al nmero que aparece a continuacin.   Por favor, tenga en cuenta que aunque hacemos todo lo posible para estar disponibles para asuntos urgentes fuera del horario de [place], no estamos disponibles las 24 horas del da, los 7 [address]  [address] de la [place].   Si tiene un problema urgente y no puede comunicarse con nosotros, puede optar por buscar atencin mdica  en el consultorio de su doctor(a), en una clnica privada, en un centro de atencin urgente o en una sala de emergencias.  Si tiene Engineer, drilling, por favor llame inmediatamente al 911 o vaya a la sala de emergencias.  Nmeros de bper  - Dr. Hester: [phone]  - Dra. Jackquline: [phone]  - Dr. Claudene: [phone]  - Dra. Kitts: [phone]  En caso de inclemencias del [place], por favor llame a nuestra lnea principal al [phone] para una actualizacin sobre el estado de cualquier retraso o cierre.  Consejos  para la medicacin en dermatologa: Por favor, guarde las cajas en las que vienen los medicamentos de uso tpico para ayudarle a seguir las instrucciones sobre dnde y cmo usarlos. Las farmacias generalmente imprimen las instrucciones del medicamento slo en las cajas y no directamente en los tubos del [place].   Si su medicamento es muy caro, por favor, pngase en contacto con landry rieger llamando al [phone] y presione la opcin 4 o envenos un mensaje a travs de Clinical cytogeneticist.   No podemos decirle cul ser su copago por los medicamentos por adelantado ya que esto es diferente dependiendo de la cobertura de su seguro. Sin embargo, es posible que podamos encontrar un medicamento sustituto a Audiological scientist un formulario para que el seguro cubra el medicamento que se considera necesario.   Si se requiere una autorizacin previa para que su compaa de seguros malta su medicamento, por favor permtanos de 1 a 2 das hbiles para completar este proceso.  Los precios de los medicamentos varan con frecuencia dependiendo del Environmental consultant de dnde se surte la receta y alguna farmacias pueden ofrecer precios ms baratos.  El sitio web www.goodrx.com tiene cupones para medicamentos de Health and safety inspector. Los precios aqu no tienen en cuenta lo que podra costar con la ayuda del seguro (puede ser ms barato con su seguro), pero el sitio web puede darle el precio si no utiliz Tourist information centre manager.  - Puede imprimir el cupn correspondiente y llevarlo con su receta a la farmacia.  - Tambin puede pasar por nuestra oficina durante el horario de atencin regular y Education officer, museum una tarjeta de cupones de GoodRx.  - Si necesita que su receta se enve electrnicamente a una farmacia diferente, informe a nuestra oficina a travs de MyChart de [place] o por telfono llamando al [phone] y presione la opcin 4.

## 2024-06-12 ENCOUNTER — Ambulatory Visit: Admitting: Dermatology

## 2024-06-12 ENCOUNTER — Encounter: Payer: Self-pay | Admitting: Dermatology

## 2024-06-12 DIAGNOSIS — L820 Inflamed seborrheic keratosis: Secondary | ICD-10-CM | POA: Diagnosis not present

## 2024-06-12 DIAGNOSIS — L570 Actinic keratosis: Secondary | ICD-10-CM

## 2024-06-12 DIAGNOSIS — Z85828 Personal history of other malignant neoplasm of skin: Secondary | ICD-10-CM

## 2024-06-12 DIAGNOSIS — L821 Other seborrheic keratosis: Secondary | ICD-10-CM | POA: Diagnosis not present

## 2024-06-12 DIAGNOSIS — W908XXA Exposure to other nonionizing radiation, initial encounter: Secondary | ICD-10-CM | POA: Diagnosis not present

## 2024-06-12 NOTE — Progress Notes (Signed)
   Follow-Up Visit   Subjective  Gregory Stark is a 83 y.o. male who presents for the following: Irregular skin lesion x 1 mth of the L ear, and irritated skin lesion on the R neck, pt would like checked today.  The following portions of the chart were reviewed this encounter and updated as appropriate: medications, allergies, medical history  Review of Systems:  No other skin or systemic complaints except as noted in HPI or Assessment and Plan.  Objective  Well appearing patient in no apparent distress; mood and affect are within normal limits.   A focused examination was performed of the following areas: the face, ears, and neck   Relevant exam findings are noted in the Assessment and Plan.  R neck x 8, L antihelix x 1 (9) Erythematous keratotic or waxy stuck-on papule or plaque. Left Antihelix Exam: Erythematous thin papules/macules with gritty scale at the L antihelix  Assessment & Plan   SEBORRHEIC KERATOSIS - Stuck-on, waxy, tan-brown papules and/or plaques  - Benign-appearing - Discussed benign etiology and prognosis. - Observe - Call for any changes  HISTORY OF SQUAMOUS CELL CARCINOMA OF THE SKIN - No evidence of recurrence today - No lymphadenopathy - Recommend regular full body skin exams - Recommend daily broad spectrum sunscreen SPF 30+ to sun-exposed areas, reapply every 2 hours as needed.  - Call if any new or changing lesions are noted between office visits  INFLAMED SEBORRHEIC KERATOSIS (9) R neck x 8, L antihelix x 1 (9) Symptomatic, irritating, patient would like treated.   Destruction of lesion - R neck x 8, L antihelix x 1 (9) Complexity: simple   Destruction method: cryotherapy   Informed consent: discussed and consent obtained   Timeout:  patient name, date of birth, surgical site, and procedure verified Lesion destroyed using liquid nitrogen: Yes   Region frozen until ice ball extended beyond lesion: Yes   Cryo cycles: 1 or 2. Outcome:  patient tolerated procedure well with no complications   Post-procedure details: wound care instructions given    AK (ACTINIC KERATOSIS) Left Antihelix Destruction of lesion - Left Antihelix Complexity: simple   Destruction method: cryotherapy   Informed consent: discussed and consent obtained   Timeout:  patient name, date of birth, surgical site, and procedure verified Lesion destroyed using liquid nitrogen: Yes   Region frozen until ice ball extended beyond lesion: Yes   Cryo cycles: 1 or 2. Outcome: patient tolerated procedure well with no complications   Post-procedure details: wound care instructions given     Return for appointment as scheduled.  Gregory Stark, CMA, am acting as scribe for Boneta Sharps, MD .   Documentation: I have reviewed the above documentation for accuracy and completeness, and I agree with the above.  Boneta Sharps, MD

## 2024-06-12 NOTE — Patient Instructions (Signed)

## 2024-07-28 ENCOUNTER — Other Ambulatory Visit: Payer: Self-pay | Admitting: Specialist

## 2024-07-28 DIAGNOSIS — J8410 Pulmonary fibrosis, unspecified: Secondary | ICD-10-CM

## 2024-09-21 ENCOUNTER — Ambulatory Visit: Admitting: Dermatology
